# Patient Record
Sex: Male | Born: 1935
Health system: Southern US, Community
[De-identification: ages and names within clinical notes are randomized; demographics above are authoritative.]

## PROBLEM LIST (undated history)

## (undated) DIAGNOSIS — I209 Angina pectoris, unspecified: Secondary | ICD-10-CM

## (undated) DIAGNOSIS — K219 Gastro-esophageal reflux disease without esophagitis: Secondary | ICD-10-CM

## (undated) DIAGNOSIS — D649 Anemia, unspecified: Secondary | ICD-10-CM

## (undated) DIAGNOSIS — H9190 Unspecified hearing loss, unspecified ear: Secondary | ICD-10-CM

## (undated) DIAGNOSIS — R011 Cardiac murmur, unspecified: Secondary | ICD-10-CM

## (undated) DIAGNOSIS — M1990 Unspecified osteoarthritis, unspecified site: Secondary | ICD-10-CM

## (undated) DIAGNOSIS — E7800 Pure hypercholesterolemia, unspecified: Secondary | ICD-10-CM

## (undated) DIAGNOSIS — I10 Essential (primary) hypertension: Secondary | ICD-10-CM

## (undated) DIAGNOSIS — E538 Deficiency of other specified B group vitamins: Secondary | ICD-10-CM

## (undated) DIAGNOSIS — K8050 Calculus of bile duct without cholangitis or cholecystitis without obstruction: Secondary | ICD-10-CM

## (undated) DIAGNOSIS — R251 Tremor, unspecified: Secondary | ICD-10-CM

## (undated) DIAGNOSIS — E119 Type 2 diabetes mellitus without complications: Secondary | ICD-10-CM

## (undated) DIAGNOSIS — J189 Pneumonia, unspecified organism: Secondary | ICD-10-CM

## (undated) HISTORY — DX: Gastro-esophageal reflux disease without esophagitis: K21.9

## (undated) HISTORY — DX: Deficiency of other specified B group vitamins: E53.8

## (undated) HISTORY — PX: APPENDECTOMY: SHX54

## (undated) HISTORY — DX: Anemia, unspecified: D64.9

## (undated) HISTORY — PX: PROSTATE BIOPSY: SHX241

## (undated) HISTORY — PX: HERNIA REPAIR: SHX51

---

## 1992-06-10 DIAGNOSIS — J189 Pneumonia, unspecified organism: Secondary | ICD-10-CM

## 1992-06-10 HISTORY — DX: Pneumonia, unspecified organism: J18.9

## 2005-11-28 ENCOUNTER — Other Ambulatory Visit: Payer: Self-pay

## 2005-11-28 ENCOUNTER — Ambulatory Visit: Payer: Self-pay | Admitting: General Surgery

## 2005-12-03 ENCOUNTER — Ambulatory Visit: Payer: Self-pay | Admitting: General Surgery

## 2008-09-01 ENCOUNTER — Ambulatory Visit: Payer: Self-pay | Admitting: Internal Medicine

## 2008-12-16 ENCOUNTER — Ambulatory Visit: Payer: Self-pay | Admitting: Gastroenterology

## 2008-12-16 HISTORY — PX: COLONOSCOPY: SHX174

## 2014-06-22 DIAGNOSIS — E538 Deficiency of other specified B group vitamins: Secondary | ICD-10-CM | POA: Diagnosis not present

## 2014-07-27 DIAGNOSIS — E538 Deficiency of other specified B group vitamins: Secondary | ICD-10-CM | POA: Diagnosis not present

## 2014-08-10 DIAGNOSIS — E538 Deficiency of other specified B group vitamins: Secondary | ICD-10-CM | POA: Diagnosis not present

## 2014-08-10 DIAGNOSIS — E782 Mixed hyperlipidemia: Secondary | ICD-10-CM | POA: Diagnosis not present

## 2014-08-10 DIAGNOSIS — E119 Type 2 diabetes mellitus without complications: Secondary | ICD-10-CM | POA: Diagnosis not present

## 2014-08-17 DIAGNOSIS — E782 Mixed hyperlipidemia: Secondary | ICD-10-CM | POA: Diagnosis not present

## 2014-08-17 DIAGNOSIS — K219 Gastro-esophageal reflux disease without esophagitis: Secondary | ICD-10-CM | POA: Diagnosis not present

## 2014-08-17 DIAGNOSIS — E538 Deficiency of other specified B group vitamins: Secondary | ICD-10-CM | POA: Diagnosis not present

## 2014-08-17 DIAGNOSIS — E119 Type 2 diabetes mellitus without complications: Secondary | ICD-10-CM | POA: Diagnosis not present

## 2014-08-31 DIAGNOSIS — E538 Deficiency of other specified B group vitamins: Secondary | ICD-10-CM | POA: Diagnosis not present

## 2014-10-05 DIAGNOSIS — E538 Deficiency of other specified B group vitamins: Secondary | ICD-10-CM | POA: Diagnosis not present

## 2014-11-09 DIAGNOSIS — E538 Deficiency of other specified B group vitamins: Secondary | ICD-10-CM | POA: Diagnosis not present

## 2014-12-14 DIAGNOSIS — E538 Deficiency of other specified B group vitamins: Secondary | ICD-10-CM | POA: Diagnosis not present

## 2014-12-15 DIAGNOSIS — E782 Mixed hyperlipidemia: Secondary | ICD-10-CM | POA: Diagnosis not present

## 2014-12-15 DIAGNOSIS — K219 Gastro-esophageal reflux disease without esophagitis: Secondary | ICD-10-CM | POA: Diagnosis not present

## 2014-12-15 DIAGNOSIS — E538 Deficiency of other specified B group vitamins: Secondary | ICD-10-CM | POA: Diagnosis not present

## 2014-12-15 DIAGNOSIS — E119 Type 2 diabetes mellitus without complications: Secondary | ICD-10-CM | POA: Diagnosis not present

## 2014-12-22 DIAGNOSIS — E782 Mixed hyperlipidemia: Secondary | ICD-10-CM | POA: Diagnosis not present

## 2014-12-22 DIAGNOSIS — Z0000 Encounter for general adult medical examination without abnormal findings: Secondary | ICD-10-CM | POA: Diagnosis not present

## 2014-12-22 DIAGNOSIS — E119 Type 2 diabetes mellitus without complications: Secondary | ICD-10-CM | POA: Diagnosis not present

## 2014-12-22 DIAGNOSIS — E538 Deficiency of other specified B group vitamins: Secondary | ICD-10-CM | POA: Diagnosis not present

## 2015-01-18 DIAGNOSIS — E538 Deficiency of other specified B group vitamins: Secondary | ICD-10-CM | POA: Diagnosis not present

## 2015-02-22 DIAGNOSIS — E538 Deficiency of other specified B group vitamins: Secondary | ICD-10-CM | POA: Diagnosis not present

## 2015-03-29 DIAGNOSIS — E538 Deficiency of other specified B group vitamins: Secondary | ICD-10-CM | POA: Diagnosis not present

## 2015-03-29 DIAGNOSIS — Z23 Encounter for immunization: Secondary | ICD-10-CM | POA: Diagnosis not present

## 2015-04-05 DIAGNOSIS — H2513 Age-related nuclear cataract, bilateral: Secondary | ICD-10-CM | POA: Diagnosis not present

## 2015-04-27 DIAGNOSIS — E782 Mixed hyperlipidemia: Secondary | ICD-10-CM | POA: Diagnosis not present

## 2015-04-27 DIAGNOSIS — K5909 Other constipation: Secondary | ICD-10-CM | POA: Diagnosis not present

## 2015-04-27 DIAGNOSIS — E119 Type 2 diabetes mellitus without complications: Secondary | ICD-10-CM | POA: Diagnosis not present

## 2015-04-27 DIAGNOSIS — K219 Gastro-esophageal reflux disease without esophagitis: Secondary | ICD-10-CM | POA: Diagnosis not present

## 2015-05-02 DIAGNOSIS — E538 Deficiency of other specified B group vitamins: Secondary | ICD-10-CM | POA: Diagnosis not present

## 2015-06-02 DIAGNOSIS — E538 Deficiency of other specified B group vitamins: Secondary | ICD-10-CM | POA: Diagnosis not present

## 2015-07-03 DIAGNOSIS — E538 Deficiency of other specified B group vitamins: Secondary | ICD-10-CM | POA: Diagnosis not present

## 2015-08-03 DIAGNOSIS — E538 Deficiency of other specified B group vitamins: Secondary | ICD-10-CM | POA: Diagnosis not present

## 2015-08-22 DIAGNOSIS — E119 Type 2 diabetes mellitus without complications: Secondary | ICD-10-CM | POA: Diagnosis not present

## 2015-08-22 DIAGNOSIS — E782 Mixed hyperlipidemia: Secondary | ICD-10-CM | POA: Diagnosis not present

## 2015-08-29 DIAGNOSIS — L259 Unspecified contact dermatitis, unspecified cause: Secondary | ICD-10-CM | POA: Diagnosis not present

## 2015-08-29 DIAGNOSIS — E782 Mixed hyperlipidemia: Secondary | ICD-10-CM | POA: Diagnosis not present

## 2015-08-29 DIAGNOSIS — E538 Deficiency of other specified B group vitamins: Secondary | ICD-10-CM | POA: Diagnosis not present

## 2015-08-29 DIAGNOSIS — K219 Gastro-esophageal reflux disease without esophagitis: Secondary | ICD-10-CM | POA: Diagnosis not present

## 2015-08-29 DIAGNOSIS — E119 Type 2 diabetes mellitus without complications: Secondary | ICD-10-CM | POA: Diagnosis not present

## 2015-09-05 DIAGNOSIS — E538 Deficiency of other specified B group vitamins: Secondary | ICD-10-CM | POA: Diagnosis not present

## 2015-10-09 DIAGNOSIS — E538 Deficiency of other specified B group vitamins: Secondary | ICD-10-CM | POA: Diagnosis not present

## 2015-11-09 DIAGNOSIS — E538 Deficiency of other specified B group vitamins: Secondary | ICD-10-CM | POA: Diagnosis not present

## 2015-12-13 DIAGNOSIS — E538 Deficiency of other specified B group vitamins: Secondary | ICD-10-CM | POA: Diagnosis not present

## 2015-12-26 DIAGNOSIS — E782 Mixed hyperlipidemia: Secondary | ICD-10-CM | POA: Diagnosis not present

## 2015-12-26 DIAGNOSIS — E538 Deficiency of other specified B group vitamins: Secondary | ICD-10-CM | POA: Diagnosis not present

## 2015-12-26 DIAGNOSIS — E119 Type 2 diabetes mellitus without complications: Secondary | ICD-10-CM | POA: Diagnosis not present

## 2016-01-02 DIAGNOSIS — E119 Type 2 diabetes mellitus without complications: Secondary | ICD-10-CM | POA: Diagnosis not present

## 2016-01-02 DIAGNOSIS — E538 Deficiency of other specified B group vitamins: Secondary | ICD-10-CM | POA: Diagnosis not present

## 2016-01-02 DIAGNOSIS — Z0000 Encounter for general adult medical examination without abnormal findings: Secondary | ICD-10-CM | POA: Diagnosis not present

## 2016-01-02 DIAGNOSIS — Z862 Personal history of diseases of the blood and blood-forming organs and certain disorders involving the immune mechanism: Secondary | ICD-10-CM | POA: Diagnosis not present

## 2016-01-02 DIAGNOSIS — Z7185 Encounter for immunization safety counseling: Secondary | ICD-10-CM | POA: Insufficient documentation

## 2016-01-02 DIAGNOSIS — Z7189 Other specified counseling: Secondary | ICD-10-CM | POA: Insufficient documentation

## 2016-01-02 DIAGNOSIS — Z23 Encounter for immunization: Secondary | ICD-10-CM | POA: Diagnosis not present

## 2016-01-02 DIAGNOSIS — R259 Unspecified abnormal involuntary movements: Secondary | ICD-10-CM | POA: Diagnosis not present

## 2016-01-02 DIAGNOSIS — E782 Mixed hyperlipidemia: Secondary | ICD-10-CM | POA: Diagnosis not present

## 2016-01-15 DIAGNOSIS — E538 Deficiency of other specified B group vitamins: Secondary | ICD-10-CM | POA: Diagnosis not present

## 2016-02-15 DIAGNOSIS — E538 Deficiency of other specified B group vitamins: Secondary | ICD-10-CM | POA: Diagnosis not present

## 2016-02-21 DIAGNOSIS — E538 Deficiency of other specified B group vitamins: Secondary | ICD-10-CM | POA: Diagnosis not present

## 2016-02-21 DIAGNOSIS — R413 Other amnesia: Secondary | ICD-10-CM | POA: Diagnosis not present

## 2016-02-21 DIAGNOSIS — G250 Essential tremor: Secondary | ICD-10-CM | POA: Diagnosis not present

## 2016-03-07 DIAGNOSIS — G250 Essential tremor: Secondary | ICD-10-CM | POA: Insufficient documentation

## 2016-03-18 DIAGNOSIS — E538 Deficiency of other specified B group vitamins: Secondary | ICD-10-CM | POA: Diagnosis not present

## 2016-03-28 ENCOUNTER — Other Ambulatory Visit: Payer: Self-pay | Admitting: Neurology

## 2016-03-28 DIAGNOSIS — G250 Essential tremor: Secondary | ICD-10-CM

## 2016-04-17 ENCOUNTER — Ambulatory Visit
Admission: RE | Admit: 2016-04-17 | Discharge: 2016-04-17 | Disposition: A | Discharge status: Home / Self Care | Payer: Commercial Managed Care - HMO | Source: Ambulatory Visit | Attending: Neurology | Admitting: Neurology

## 2016-04-17 DIAGNOSIS — G250 Essential tremor: Secondary | ICD-10-CM | POA: Diagnosis not present

## 2016-04-17 DIAGNOSIS — R413 Other amnesia: Secondary | ICD-10-CM | POA: Insufficient documentation

## 2016-04-17 DIAGNOSIS — R251 Tremor, unspecified: Secondary | ICD-10-CM | POA: Diagnosis not present

## 2016-04-18 DIAGNOSIS — E538 Deficiency of other specified B group vitamins: Secondary | ICD-10-CM | POA: Diagnosis not present

## 2016-04-19 DIAGNOSIS — H2513 Age-related nuclear cataract, bilateral: Secondary | ICD-10-CM | POA: Diagnosis not present

## 2016-04-30 DIAGNOSIS — E119 Type 2 diabetes mellitus without complications: Secondary | ICD-10-CM | POA: Diagnosis not present

## 2016-04-30 DIAGNOSIS — E782 Mixed hyperlipidemia: Secondary | ICD-10-CM | POA: Diagnosis not present

## 2016-05-09 DIAGNOSIS — K219 Gastro-esophageal reflux disease without esophagitis: Secondary | ICD-10-CM | POA: Diagnosis not present

## 2016-05-09 DIAGNOSIS — E538 Deficiency of other specified B group vitamins: Secondary | ICD-10-CM | POA: Diagnosis not present

## 2016-05-09 DIAGNOSIS — Z862 Personal history of diseases of the blood and blood-forming organs and certain disorders involving the immune mechanism: Secondary | ICD-10-CM | POA: Diagnosis not present

## 2016-05-09 DIAGNOSIS — E782 Mixed hyperlipidemia: Secondary | ICD-10-CM | POA: Diagnosis not present

## 2016-05-09 DIAGNOSIS — E119 Type 2 diabetes mellitus without complications: Secondary | ICD-10-CM | POA: Diagnosis not present

## 2016-05-20 DIAGNOSIS — E538 Deficiency of other specified B group vitamins: Secondary | ICD-10-CM | POA: Diagnosis not present

## 2016-06-20 DIAGNOSIS — E538 Deficiency of other specified B group vitamins: Secondary | ICD-10-CM | POA: Diagnosis not present

## 2016-06-24 DIAGNOSIS — R413 Other amnesia: Secondary | ICD-10-CM | POA: Diagnosis not present

## 2016-06-24 DIAGNOSIS — G250 Essential tremor: Secondary | ICD-10-CM | POA: Diagnosis not present

## 2016-06-24 DIAGNOSIS — E538 Deficiency of other specified B group vitamins: Secondary | ICD-10-CM | POA: Diagnosis not present

## 2016-07-22 DIAGNOSIS — E538 Deficiency of other specified B group vitamins: Secondary | ICD-10-CM | POA: Diagnosis not present

## 2016-08-22 DIAGNOSIS — E538 Deficiency of other specified B group vitamins: Secondary | ICD-10-CM | POA: Diagnosis not present

## 2016-09-23 DIAGNOSIS — E538 Deficiency of other specified B group vitamins: Secondary | ICD-10-CM | POA: Diagnosis not present

## 2016-10-31 DIAGNOSIS — E119 Type 2 diabetes mellitus without complications: Secondary | ICD-10-CM | POA: Diagnosis not present

## 2016-10-31 DIAGNOSIS — E538 Deficiency of other specified B group vitamins: Secondary | ICD-10-CM | POA: Diagnosis not present

## 2016-10-31 DIAGNOSIS — E782 Mixed hyperlipidemia: Secondary | ICD-10-CM | POA: Diagnosis not present

## 2016-10-31 DIAGNOSIS — Z862 Personal history of diseases of the blood and blood-forming organs and certain disorders involving the immune mechanism: Secondary | ICD-10-CM | POA: Diagnosis not present

## 2016-11-07 DIAGNOSIS — J3089 Other allergic rhinitis: Secondary | ICD-10-CM | POA: Diagnosis not present

## 2016-11-07 DIAGNOSIS — Z862 Personal history of diseases of the blood and blood-forming organs and certain disorders involving the immune mechanism: Secondary | ICD-10-CM | POA: Diagnosis not present

## 2016-11-07 DIAGNOSIS — E119 Type 2 diabetes mellitus without complications: Secondary | ICD-10-CM | POA: Diagnosis not present

## 2016-11-07 DIAGNOSIS — E538 Deficiency of other specified B group vitamins: Secondary | ICD-10-CM | POA: Diagnosis not present

## 2016-11-07 DIAGNOSIS — E782 Mixed hyperlipidemia: Secondary | ICD-10-CM | POA: Diagnosis not present

## 2016-11-14 DIAGNOSIS — B9689 Other specified bacterial agents as the cause of diseases classified elsewhere: Secondary | ICD-10-CM | POA: Diagnosis not present

## 2016-11-14 DIAGNOSIS — J019 Acute sinusitis, unspecified: Secondary | ICD-10-CM | POA: Diagnosis not present

## 2016-11-14 DIAGNOSIS — J209 Acute bronchitis, unspecified: Secondary | ICD-10-CM | POA: Diagnosis not present

## 2016-11-21 DIAGNOSIS — R5383 Other fatigue: Secondary | ICD-10-CM | POA: Diagnosis not present

## 2016-12-02 DIAGNOSIS — E538 Deficiency of other specified B group vitamins: Secondary | ICD-10-CM | POA: Diagnosis not present

## 2017-01-02 DIAGNOSIS — E538 Deficiency of other specified B group vitamins: Secondary | ICD-10-CM | POA: Diagnosis not present

## 2017-01-17 ENCOUNTER — Other Ambulatory Visit: Payer: Self-pay | Admitting: Family Medicine

## 2017-01-17 ENCOUNTER — Emergency Department: Payer: Medicare HMO

## 2017-01-17 ENCOUNTER — Ambulatory Visit
Admission: RE | Admit: 2017-01-17 | Discharge: 2017-01-17 | Disposition: A | Discharge status: Home / Self Care | Payer: Medicare HMO | Source: Ambulatory Visit | Attending: Family Medicine | Admitting: Family Medicine

## 2017-01-17 ENCOUNTER — Other Ambulatory Visit: Payer: Self-pay

## 2017-01-17 ENCOUNTER — Inpatient Hospital Stay
Admission: EM | Admit: 2017-01-17 | Discharge: 2017-01-20 | DRG: 871 | Disposition: A | Discharge status: Home / Self Care | Payer: Medicare HMO | Attending: General Surgery | Admitting: General Surgery

## 2017-01-17 DIAGNOSIS — K8000 Calculus of gallbladder with acute cholecystitis without obstruction: Secondary | ICD-10-CM | POA: Diagnosis not present

## 2017-01-17 DIAGNOSIS — R05 Cough: Secondary | ICD-10-CM | POA: Diagnosis not present

## 2017-01-17 DIAGNOSIS — R1011 Right upper quadrant pain: Secondary | ICD-10-CM | POA: Diagnosis not present

## 2017-01-17 DIAGNOSIS — K810 Acute cholecystitis: Secondary | ICD-10-CM | POA: Diagnosis not present

## 2017-01-17 DIAGNOSIS — N179 Acute kidney failure, unspecified: Secondary | ICD-10-CM | POA: Diagnosis present

## 2017-01-17 DIAGNOSIS — R079 Chest pain, unspecified: Secondary | ICD-10-CM | POA: Diagnosis not present

## 2017-01-17 DIAGNOSIS — Z8249 Family history of ischemic heart disease and other diseases of the circulatory system: Secondary | ICD-10-CM | POA: Diagnosis not present

## 2017-01-17 DIAGNOSIS — R0789 Other chest pain: Secondary | ICD-10-CM | POA: Diagnosis not present

## 2017-01-17 DIAGNOSIS — A419 Sepsis, unspecified organism: Principal | ICD-10-CM | POA: Diagnosis present

## 2017-01-17 DIAGNOSIS — K750 Abscess of liver: Secondary | ICD-10-CM | POA: Diagnosis not present

## 2017-01-17 DIAGNOSIS — F17290 Nicotine dependence, other tobacco product, uncomplicated: Secondary | ICD-10-CM | POA: Diagnosis present

## 2017-01-17 DIAGNOSIS — I10 Essential (primary) hypertension: Secondary | ICD-10-CM | POA: Diagnosis present

## 2017-01-17 DIAGNOSIS — E119 Type 2 diabetes mellitus without complications: Secondary | ICD-10-CM | POA: Diagnosis present

## 2017-01-17 DIAGNOSIS — Z79899 Other long term (current) drug therapy: Secondary | ICD-10-CM

## 2017-01-17 DIAGNOSIS — K769 Liver disease, unspecified: Secondary | ICD-10-CM

## 2017-01-17 DIAGNOSIS — Z7984 Long term (current) use of oral hypoglycemic drugs: Secondary | ICD-10-CM

## 2017-01-17 DIAGNOSIS — K7689 Other specified diseases of liver: Secondary | ICD-10-CM | POA: Diagnosis not present

## 2017-01-17 DIAGNOSIS — K839 Disease of biliary tract, unspecified: Secondary | ICD-10-CM | POA: Diagnosis not present

## 2017-01-17 DIAGNOSIS — R109 Unspecified abdominal pain: Secondary | ICD-10-CM | POA: Diagnosis not present

## 2017-01-17 DIAGNOSIS — K651 Peritoneal abscess: Secondary | ICD-10-CM | POA: Diagnosis not present

## 2017-01-17 HISTORY — DX: Type 2 diabetes mellitus without complications: E11.9

## 2017-01-17 HISTORY — DX: Unspecified osteoarthritis, unspecified site: M19.90

## 2017-01-17 HISTORY — DX: Essential (primary) hypertension: I10

## 2017-01-17 LAB — URINALYSIS, COMPLETE (UACMP) WITH MICROSCOPIC
BACTERIA UA: NONE SEEN
Bilirubin Urine: NEGATIVE
Glucose, UA: >=500 mg/dL — AB
HGB URINE DIPSTICK: NEGATIVE
Ketones, ur: NEGATIVE mg/dL
Leukocytes, UA: NEGATIVE
Nitrite: NEGATIVE
PROTEIN: 100 mg/dL — AB
Specific Gravity, Urine: 1.023 (ref 1.005–1.030)
pH: 6 (ref 5.0–8.0)

## 2017-01-17 LAB — COMPREHENSIVE METABOLIC PANEL
ALBUMIN: 3.3 g/dL — AB (ref 3.5–5.0)
ALT: 21 U/L (ref 17–63)
ANION GAP: 9 (ref 5–15)
AST: 29 U/L (ref 15–41)
Alkaline Phosphatase: 89 U/L (ref 38–126)
BUN: 41 mg/dL — ABNORMAL HIGH (ref 6–20)
CHLORIDE: 106 mmol/L (ref 101–111)
CO2: 23 mmol/L (ref 22–32)
Calcium: 9 mg/dL (ref 8.9–10.3)
Creatinine, Ser: 1.56 mg/dL — ABNORMAL HIGH (ref 0.61–1.24)
GFR calc non Af Amer: 39 mL/min — ABNORMAL LOW (ref >60)
GFR, EST AFRICAN AMERICAN: 45 mL/min — AB (ref >60)
GLUCOSE: 349 mg/dL — AB (ref 65–99)
Potassium: 4.3 mmol/L (ref 3.5–5.1)
SODIUM: 138 mmol/L (ref 135–145)
Total Bilirubin: 0.9 mg/dL (ref 0.3–1.2)
Total Protein: 7.6 g/dL (ref 6.5–8.1)

## 2017-01-17 LAB — LIPASE, BLOOD: LIPASE: 24 U/L (ref 11–51)

## 2017-01-17 LAB — CBC
HEMATOCRIT: 34.4 % — AB (ref 40.0–52.0)
HEMOGLOBIN: 11.4 g/dL — AB (ref 13.0–18.0)
MCH: 28.1 pg (ref 26.0–34.0)
MCHC: 33.3 g/dL (ref 32.0–36.0)
MCV: 84.6 fL (ref 80.0–100.0)
Platelets: 216 10*3/uL (ref 150–440)
RBC: 4.07 MIL/uL — AB (ref 4.40–5.90)
RDW: 18.1 % — ABNORMAL HIGH (ref 11.5–14.5)
WBC: 12.8 10*3/uL — ABNORMAL HIGH (ref 3.8–10.6)

## 2017-01-17 LAB — PROTIME-INR
INR: 1.34
PROTHROMBIN TIME: 16.7 s — AB (ref 11.4–15.2)

## 2017-01-17 LAB — LACTIC ACID, PLASMA: LACTIC ACID, VENOUS: 1.8 mmol/L (ref 0.5–1.9)

## 2017-01-17 LAB — TROPONIN I

## 2017-01-17 MED ORDER — IOPAMIDOL (ISOVUE-300) INJECTION 61%
30.0000 mL | Freq: Once | INTRAVENOUS | Status: AC | PRN
  Administered 2017-01-17: 30 mL via ORAL

## 2017-01-17 MED ORDER — DEXTROSE IN LACTATED RINGERS 5 % IV SOLN
INTRAVENOUS | Status: DC
  Administered 2017-01-18 (×2): via INTRAVENOUS

## 2017-01-17 MED ORDER — MORPHINE SULFATE (PF) 2 MG/ML IV SOLN
2.0000 mg | INTRAVENOUS | Status: DC | PRN
  Administered 2017-01-18: 2 mg via INTRAVENOUS
  Filled 2017-01-17: qty 1

## 2017-01-17 MED ORDER — DIPHENHYDRAMINE HCL 12.5 MG/5ML PO ELIX: 12.5000 mg | ORAL_SOLUTION | Freq: Four times a day (QID) | ORAL | Status: DC | PRN

## 2017-01-17 MED ORDER — ONDANSETRON 4 MG PO TBDP: 4.0000 mg | ORAL_TABLET | Freq: Four times a day (QID) | ORAL | Status: DC | PRN

## 2017-01-17 MED ORDER — ONDANSETRON HCL 4 MG/2ML IJ SOLN: 4.0000 mg | Freq: Four times a day (QID) | INTRAMUSCULAR | Status: DC | PRN

## 2017-01-17 MED ORDER — IOPAMIDOL (ISOVUE-300) INJECTION 61%
75.0000 mL | Freq: Once | INTRAVENOUS | Status: AC | PRN
  Administered 2017-01-17: 75 mL via INTRAVENOUS

## 2017-01-17 MED ORDER — DIPHENHYDRAMINE HCL 50 MG/ML IJ SOLN: 12.5000 mg | Freq: Four times a day (QID) | INTRAMUSCULAR | Status: DC | PRN

## 2017-01-17 MED ORDER — PIPERACILLIN-TAZOBACTAM 3.375 G IVPB 30 MIN
3.3750 g | Freq: Once | INTRAVENOUS | Status: AC
  Administered 2017-01-17: 3.375 g via INTRAVENOUS

## 2017-01-17 MED ORDER — ONDANSETRON HCL 4 MG/2ML IJ SOLN
4.0000 mg | INTRAMUSCULAR | Status: AC
  Administered 2017-01-17: 4 mg via INTRAVENOUS
  Filled 2017-01-17: qty 2

## 2017-01-17 MED ORDER — HYDRALAZINE HCL 20 MG/ML IJ SOLN: 10.0000 mg | INTRAMUSCULAR | Status: DC | PRN

## 2017-01-17 MED ORDER — PIPERACILLIN-TAZOBACTAM 3.375 G IVPB
3.3750 g | Freq: Three times a day (TID) | INTRAVENOUS | Status: DC
  Administered 2017-01-18 – 2017-01-19 (×4): 3.375 g via INTRAVENOUS
  Filled 2017-01-17 (×5): qty 50

## 2017-01-17 MED ORDER — PIPERACILLIN-TAZOBACTAM 3.375 G IVPB 30 MIN
INTRAVENOUS | Status: AC
  Filled 2017-01-17: qty 50

## 2017-01-17 MED ORDER — SODIUM CHLORIDE 0.9 % IV BOLUS (SEPSIS)
500.0000 mL | INTRAVENOUS | Status: AC
  Administered 2017-01-17: 500 mL via INTRAVENOUS

## 2017-01-17 MED ORDER — MORPHINE SULFATE (PF) 4 MG/ML IV SOLN
4.0000 mg | Freq: Once | INTRAVENOUS | Status: AC
  Administered 2017-01-17: 4 mg via INTRAVENOUS
  Filled 2017-01-17: qty 1

## 2017-01-17 MED ORDER — INSULIN ASPART 100 UNIT/ML ~~LOC~~ SOLN
0.0000 [IU] | SUBCUTANEOUS | Status: DC
  Administered 2017-01-18: 3 [IU] via SUBCUTANEOUS
  Administered 2017-01-18: 5 [IU] via SUBCUTANEOUS
  Administered 2017-01-18: 3 [IU] via SUBCUTANEOUS
  Filled 2017-01-17 (×3): qty 1

## 2017-01-17 MED ORDER — PANTOPRAZOLE SODIUM 40 MG IV SOLR
40.0000 mg | Freq: Every day | INTRAVENOUS | Status: DC
  Administered 2017-01-18 – 2017-01-19 (×3): 40 mg via INTRAVENOUS
  Filled 2017-01-17 (×3): qty 40

## 2017-01-17 NOTE — ED Provider Notes (Signed)
[facility] Emergency Department Provider Note  ____________________________________________   First MD Initiated Contact with Patient 01/17/17 1824     (approximate)  I have reviewed the triage vital signs and the nursing notes.   HISTORY  Chief Complaint Abdominal Pain    HPI Benjamin Austin is a 83 y.o. male Who presents for evaluation of gradually worsening abdominal pain for about 4 days.  his pain is primarily in the upper abdomen, mostly on the right side.  It is more or less constant although he gets slightly worse after he eats.  He denies nausea and vomiting.  He also denies fever/chills, chest pain, shortness of breath, and dysuria.  he states that the pain gets significantly worse when he coughs.  The pain does not radiate.  He states that nothing in particular makes the pain better.  He had an ultrasound performed as an outpatient which is concerning for acute cholecystitis, but also demonstrates a large hepatic fluid collection which may represent an abscess, and the radiologist recommended CT scan for further evaluation.  Patient reports severe pain at this time and appears uncomfortable but non-toxic.   Past Medical History:  Diagnosis Date  . Arthritis   . Diabetes mellitus without complication ([place])   . Hypertension     There are no active problems to display for this patient.   Past Surgical History:  Procedure Laterality Date  . APPENDECTOMY    . HERNIA REPAIR      Prior to Admission medications   Medication Sig Start Date End Date Taking? Authorizing Provider  Calcium Carbonate-Vitamin D 600-400 MG-UNIT tablet Take 1 tablet by mouth daily.   Yes [provider]  lisinopril (PRINIVIL,ZESTRIL) 20 MG tablet Take 20 mg by mouth daily. 11/07/16  Yes [provider]  metFORMIN (GLUCOPHAGE) 1000 MG tablet Take 1,000 mg by mouth 2 (two) times daily. 11/07/16  Yes [provider]  Omega-3 1000 MG CAPS Take 1,000  mg by mouth daily.   Yes [provider]  pravastatin (PRAVACHOL) 20 MG tablet Take 10 mg by mouth at bedtime. 11/07/16  Yes [provider]    Allergies Patient has no known allergies.  No family history on file.  Social History Social History  Substance Use Topics  . Smoking status: Current Every Day Smoker    Types: Cigars  . Smokeless tobacco: Never Used  . Alcohol use No    Review of Systems Constitutional: No fever/chills Eyes: No visual changes. ENT: No sore throat. Cardiovascular: Denies chest pain. Respiratory: Denies shortness of breath.  Worsening abd pain with deep inspiration Gastrointestinal: worsening upper abd pain x 4 days.  Denies N/V/D Genitourinary: Negative for dysuria. Musculoskeletal: Negative for neck pain.  Negative for back pain. Integumentary: Negative for rash. Neurological: Negative for headaches, focal weakness or numbness.   ____________________________________________   PHYSICAL EXAM:  VITAL SIGNS: ED Triage Vitals  Enc Vitals Group     BP 01/17/17 1614 138/68     Pulse Rate 01/17/17 1614 (!) 110     Resp 01/17/17 1614 16     Temp 01/17/17 1614 98.4 F (36.9 C)     Temp Source 01/17/17 1614 Oral     SpO2 01/17/17 1614 96 %     Weight 01/17/17 1615 71.7 kg (158 lb)     Height --      Head Circumference --      Peak Flow --      Pain Score 01/17/17 1613 10  Pain Loc --      Pain Edu? --      Excl. in [place]? --     Constitutional: Alert and oriented. Well appearing and in no acute distress but does appear uncomfortable Eyes: Conjunctivae are normal.  Head: Atraumatic. Nose: No congestion/rhinnorhea. Mouth/Throat: Mucous membranes are moist. Neck: No stridor.  No meningeal signs.   Cardiovascular: Tachycardia, regular rhythm. Good peripheral circulation. Grossly normal heart sounds. Respiratory: Normal respiratory effort.  No retractions. Lungs CTAB. Gastrointestinal: Soft but with severe TTP of the upper  abdomen including RUQ and epigastrium and +murphy's sign.  Diffuse mild lower abd tenderness but likely referred from the upper abd pain. Musculoskeletal: No lower extremity tenderness nor edema. No gross deformities of extremities. Neurologic:  Normal speech and language. No gross focal neurologic deficits are appreciated.  Skin:  Skin is warm, dry and intact. No rash noted. Psychiatric: Mood and affect are normal. Speech and behavior are normal.  ____________________________________________   LABS (all labs ordered are listed, but only abnormal results are displayed)  Labs Reviewed  COMPREHENSIVE METABOLIC PANEL - Abnormal; Notable for the following:       Result Value   Glucose, Bld 349 (*)    BUN 41 (*)    Creatinine, Ser 1.56 (*)    Albumin 3.3 (*)    GFR calc non Af Amer 39 (*)    GFR calc Af Amer 45 (*)    All other components within normal limits  CBC - Abnormal; Notable for the following:    WBC 12.8 (*)    RBC 4.07 (*)    Hemoglobin 11.4 (*)    HCT 34.4 (*)    RDW 18.1 (*)    All other components within normal limits  URINALYSIS, COMPLETE (UACMP) WITH MICROSCOPIC - Abnormal; Notable for the following:    Color, Urine YELLOW (*)    APPearance CLEAR (*)    Glucose, UA >=500 (*)    Protein, ur 100 (*)    Squamous Epithelial / LPF 0-5 (*)    All other components within normal limits  PROTIME-INR - Abnormal; Notable for the following:    Prothrombin Time 16.7 (*)    All other components within normal limits  CULTURE, BLOOD (ROUTINE X 2)  CULTURE, BLOOD (ROUTINE X 2)  LIPASE, BLOOD  TROPONIN I  LACTIC ACID, PLASMA  LACTIC ACID, PLASMA   ____________________________________________  EKG  ED ECG REPORT I, Roscoe Witts, the attending physician, personally viewed and interpreted this ECG.  Date: 01/17/2017 EKG Time: 16:17 Rate: 104 Rhythm: sinus tachycardia QRS Axis: normal Intervals: normal ST/T Wave abnormalities: T-wave inversion in lead III and  avF Narrative Interpretation: Non-specific ST segment / T-wave changes, but no evidence of acute ischemia.   ____________________________________________  RADIOLOGY   Ct Abdomen Pelvis W Contrast  Result Date: 01/17/2017 CLINICAL DATA:  Leukocytosis. Cholelithiasis. Abdominal pain since 01/14/2017. EXAM: CT ABDOMEN AND PELVIS WITH CONTRAST TECHNIQUE: Multidetector CT imaging of the abdomen and pelvis was performed using the standard protocol following bolus administration of intravenous contrast. CONTRAST:  32mL ISOVUE-300 IOPAMIDOL (ISOVUE-300) INJECTION 61% COMPARISON:  01/17/2017 right upper quadrant ultrasound FINDINGS: Lower chest: Top normal sized heart without pericardial effusion. There is aortic atherosclerosis with three-vessel coronary arteriosclerosis. Small right effusion with adjacent atelectasis and/or scarring. Hepatobiliary: Subcapsular nonspecific fluid is noted along the periphery of the right hepatic lobe measuring 11.4 x 3.7 x 8.5 cm. Stigmata of prior trauma or possibly infection might account for this appearance. The gallbladder is moderately  distended with layering calculi and slight wall thickening near the fundus. Cholecystitis is not excluded. No intrahepatic biliary dilatation. Pancreas: Normal appearing pancreas without mass or ductal dilatation. Spleen: Small splenule measuring 2.1 cm at the splenic hilum without splenomegaly or mass. Adrenals/Urinary Tract: Normal bilateral adrenal glands. 8 mm hypodensity in the upper pole the right kidney too small to further characterize but statistically consistent with a cyst. No nephrolithiasis nor hydroureteronephrosis. The urinary bladder is physiologically distended without acute abnormality. Stomach/Bowel: Contrast distended stomach. Normal small bowel rotation without obstruction or acute inflammation. Status post appendectomy by report. A moderate amount of fecal residue is seen throughout large bowel with scattered colonic  diverticulosis along the sigmoid colon. No evidence of acute diverticulitis. Vascular/Lymphatic: Aortoiliac atherosclerosis without aneurysm. No lymphadenopathy. Reproductive: Normal prostate and seminal vesicles. Other: Fat noted in the right inguinal canal with evidence of prior left inguinal hernia repair. Musculoskeletal: Mild thoracic spondylosis with osteophytes anteriorly. No acute nor suspicious osseous abnormality. Moderate-to-marked lower lumbar facet arthropathy with joint space narrowing and sclerosis most evident L4 through S1. IMPRESSION: 1. Moderate distention of the gallbladder with wall thickening and cholelithiasis. Cholecystitis is not excluded. 2. Slightly complex fluid overlying the right hepatic lobe may represent a subcapsular abscess or possibly hematoma. This measures approximately 11.4 x 3.7 x 8.5 cm. 3. Small right pleural effusion with adjacent atelectasis. 4. Too small to characterize 8 mm right upper pole hypodensity statistically consistent with a cyst. Presently no worrisome features. 5. Fat seen in the right inguinal canal that may represent herniation of properitoneal fat. Status post left inguinal hernia repair. 6. Lower lumbar facet arthropathy. Electronically Signed   By: Ashley Royalty M.D.   On: 01/17/2017 20:52   US Abdomen Limited Ruq  Result Date: 01/17/2017 CLINICAL DATA:  Right upper quadrant pain for 5 days. EXAM: ULTRASOUND ABDOMEN LIMITED RIGHT UPPER QUADRANT COMPARISON:  Abdominal ultrasound 09/01/2008. FINDINGS: Gallbladder: A large volume of sludge is present within the gallbladder. Stones are identified measuring up to 1.1 cm and the gallbladder wall is thickened at 0.5 cm. There is a small amount pericholecystic fluid. Sonographer reports negative Murphy's sign. Common bile duct: Diameter: 0.3 cm Liver: Echogenicity is increased and echotexture is coarsened. There is a fluid collection along the anterior aspect of the right hepatic lobe which contains debris and  measures 7.2 x 2.4 x 6.0 cm. This is not seen on the prior exam. IMPRESSION: Large fluid collection along the anterior right hepatic lobe cannot be definitively characterized but could be an abscess. CT abdomen and pelvis with contrast is recommended for further evaluation. Large volume of gallbladder sludge and stones with wall thickening worrisome for acute cholecystitis. These results will be called to the ordering clinician or representative by the Radiologist Assistant, and communication documented in the PACS or zVision Dashboard. Electronically Signed   By: Inge Rise M.D.   On: 01/17/2017 14:21    ____________________________________________   PROCEDURES  Critical Care performed: Yes, see critical care procedure note(s)   Procedure(s) performed:   .Critical Care Performed by: Hinda Kehr Authorized by: Hinda Kehr   Critical care provider statement:    Critical care time (minutes):  30   Critical care time was exclusive of:  Separately billable procedures and treating other patients   Critical care was necessary to treat or prevent imminent or life-threatening deterioration of the following conditions:  Sepsis   Critical care was time spent personally by me on the following activities:  Development of treatment plan with  patient or surrogate, discussions with consultants, evaluation of patient's response to treatment, examination of patient, obtaining history from patient or surrogate, ordering and performing treatments and interventions, ordering and review of laboratory studies, ordering and review of radiographic studies, pulse oximetry, re-evaluation of patient's condition and review of old charts      ____________________________________________   [place] / [place] / ED COURSE  Pertinent labs & imaging results that were available during my care of the patient were reviewed by me and considered in my medical decision making (see chart for  details). The patient appears uncomfortable but nontoxic.  Given the question of intra-abdominal or hepatic abscess, I will obtain a CT scan as recommended by radiology prior to calling surgery.   The patient does meet sepsis criteria so I will administer empiric antibiotics for intraabdominal infection and make him a code sepsis.  Starting with 1L NS bolus given his age until or unless he demonstrates severe sepsis or shock.  Clinical Course as of Jan 18 2136  Ludwig Clarks Jan 17, 2017  2043 Lactic Acid, Venous: 1.8 [CF]  2058 Discussed case with Dr. Adonis Huguenin.  He is going to review the imaging and come to the ED to evaluate the patient and discuss disposition plan with me. CT Abdomen Pelvis W Contrast [CF]  2134 Dr. Adonis Huguenin has personally evaluated the patient and is currently speaking with [facility] to try and determine if the liver region is more likely to be an abscess or hematoma.  This will determine if the patient is admitted here (as with an abscess) or transferred to an academic institution (if it is a hematoma).    [CF]  2136 Transferring ED care to Dr. Joni Fears to follow up Dr. Reginal Lutes recommendations.  [CF]    Clinical Course User Index [CF] Hinda Kehr, MD    ____________________________________________  FINAL CLINICAL IMPRESSION(S) / ED DIAGNOSES  Final diagnoses:  None     MEDICATIONS GIVEN DURING THIS VISIT:  Medications  morphine 4 MG/ML injection 4 mg (4 mg Intravenous Given 01/17/17 1908)  ondansetron (ZOFRAN) injection 4 mg (4 mg Intravenous Given 01/17/17 1908)  sodium chloride 0.9 % bolus 500 mL (0 mLs Intravenous Stopped 01/17/17 2004)  iopamidol (ISOVUE-300) 61 % injection 30 mL (30 mLs Oral Contrast Given 01/17/17 1911)  piperacillin-tazobactam (ZOSYN) IVPB 3.375 g (0 g Intravenous Stopped 01/17/17 2034)  iopamidol (ISOVUE-300) 61 % injection 75 mL (75 mLs Intravenous Contrast Given 01/17/17 2026)     NEW OUTPATIENT MEDICATIONS STARTED DURING THIS  VISIT:  New Prescriptions   No medications on file    Modified Medications   No medications on file    Discontinued Medications   CALCIUM CARBONATE-VITAMIN D (CALTRATE 600+D PO)    Take 1 tablet by mouth daily.     Note:  This document was prepared using Dragon voice recognition software and may include unintentional dictation errors.    Hinda Kehr, MD 01/17/17 2137

## 2017-01-17 NOTE — ED Triage Notes (Signed)
Pt sent by doctor for possible infection. Pt's wife states several tests were run including a CBC which showed an elevated WBC count. An [place] was performed and pt was told it was possible he was having problems with his gallbladder.  Pt states he has been having abdominal pain since 8/7. Pt denies any other symptoms/.

## 2017-01-17 NOTE — ED Notes (Signed)
Patient r/f CT. 

## 2017-01-17 NOTE — H&P (Signed)
Patient ID: Benjamin Austin, male   DOB: [birth year], 83 y.o.   MRN: [id number]  CC: RUQ PAIN  HPI Benjamin Austin is a 83 y.o. male who presents emergency department for evaluation of right upper quadrant pain. Per report is been going on for about 4 days. He states the pain is constant but worsens with coughing, deep breathing, strenuous activity. He's never had anything like this before. He denies any trauma to his right upper quadrant pain although it did slightly worsen after completing his oral contrast he states it does not formally affected by eating. The pain is a sharp stabbing sensation when its at its worst. Patient denies feelings of fevers or chills but was febrile on presentation to the ER. He denies any chest pain, shortness breath, nausea, vomiting, diarrhea, constipation. Patient was undergoing an outpatient workup for this and was recommended to come to the ER after an ultrasound showed fluid in his liver.  HPI  Past Medical History:  Diagnosis Date  . Arthritis   . Diabetes mellitus without complication ([place])   . Hypertension     Past Surgical History:  Procedure Laterality Date  . APPENDECTOMY    . HERNIA REPAIR      Family history: Father with history of coronary artery disease. No other family history of cancer or diabetes.  Social History Social History  Substance Use Topics  . Smoking status: Current Every Day Smoker    Types: Cigars  . Smokeless tobacco: Never Used  . Alcohol use No    No Known Allergies  No current facility-administered medications for this encounter.    Current Outpatient Prescriptions  Medication Sig Dispense Refill  . Calcium Carbonate-Vitamin D 600-400 MG-UNIT tablet Take 1 tablet by mouth daily.    Marland Kitchen lisinopril (PRINIVIL,ZESTRIL) 20 MG tablet Take 20 mg by mouth daily.    . metFORMIN (GLUCOPHAGE) 1000 MG tablet Take 1,000 mg by mouth 2 (two) times daily.    . Omega-3 1000 MG CAPS Take 1,000 mg by mouth daily.    . pravastatin  (PRAVACHOL) 20 MG tablet Take 10 mg by mouth at bedtime.       Review of Systems A Multi-point review of systems was asked and was negative except for the findings documented in the history of present illness  Physical Exam Blood pressure (!) 143/78, pulse 89, temperature 98.4 F (36.9 C), temperature source Oral, resp. rate (!) 24, height 5\' 7"  (1.702 m), weight 68.7 kg (151 lb 8 oz), SpO2 96 %. CONSTITUTIONAL: Resting in bed in no obvious distress. EYES: Pupils are equal, round, and reactive to light, Sclera are non-icteric. EARS, NOSE, MOUTH AND THROAT: The oropharynx is clear. The oral mucosa is pink and moist. Hearing is intact to voice. LYMPH NODES:  Lymph nodes in the neck are normal. RESPIRATORY:  Lungs are clear. There is normal respiratory effort, with equal breath sounds bilaterally, and without pathologic use of accessory muscles. CARDIOVASCULAR: Heart is regular without murmurs, gallops, or rubs. GI: The abdomen is soft, exquisitely tender to palpation in the right upper quadrant especially the most lateral aspects of the right upper quadrant with a negative Murphy sign on my exam, and mildly distended. There are no palpable masses. There is no hepatosplenomegaly. There are normal bowel sounds in all quadrants. GU: Rectal deferred.   MUSCULOSKELETAL: Normal muscle strength and tone. No cyanosis or edema.   SKIN: Turgor is good and there are no pathologic skin lesions or ulcers. NEUROLOGIC: Motor and sensation is  grossly normal. Cranial nerves are grossly intact. PSYCH:  Oriented to person, place and time. Affect is normal.  Data Reviewed Images and labs reviewed. Labs showed leukocytosis of 12.8, mildly elevated creatinine of 1.56, with the remainder of his labs being within normal limits. Ultrasound and CT scan of the abdomen reviewed. There does appear to be evidence of cholelithiasis with possible gallbladder wall thickening consistent with cholecystitis. There is also a fluid  collection that appears be a subcapsular abscess overlying the right hepatic lobe measuring 11.4 x 3.7 x 8.5 cm. I have personally reviewed the patient's imaging, laboratory findings and medical records.    Assessment    Right upper quadrant pain from combination cholecystitis and subcapsular right hepatic abscess    Plan    83 year old male with right upper quadrant pain. The majority his pain appears related to his subcapsular abscess secondary to his cholecystitis. Discussed the possibility of it being a hematoma vs an abscess with the patient given his presentation of tachycardia, afebrile, lack of trauma discussed the abscess is far more likely. Case was discussed with the on-call interventional radiologist who agreed with this assessment. Plan will be for admission, IV antibiotics, percutaneous drainage performed tomorrow by interventional radiology. Discussed with patient that the likely causes cholecystitis and that he will eventually need a cholecystectomy. However, we will address the abscess versus he is mostly symptomatic from that. All questions answered to the patient's and patient's wife's satisfaction and agree with this plan.     Time spent with the patient was 55 minutes, with more than 50% of the time spent in face-to-face education, counseling and care coordination.     Clayburn Pert, MD FACS General Surgeon 01/17/2017, 10:13 PM

## 2017-01-17 NOTE — ED Notes (Signed)
Bed says "Cleaning" not "Ready", will call report when bed is showing ready.

## 2017-01-18 ENCOUNTER — Inpatient Hospital Stay: Payer: Medicare HMO

## 2017-01-18 ENCOUNTER — Encounter: Payer: Self-pay | Admitting: Interventional Radiology

## 2017-01-18 DIAGNOSIS — K8050 Calculus of bile duct without cholangitis or cholecystitis without obstruction: Secondary | ICD-10-CM

## 2017-01-18 DIAGNOSIS — K810 Acute cholecystitis: Secondary | ICD-10-CM

## 2017-01-18 HISTORY — DX: Calculus of bile duct without cholangitis or cholecystitis without obstruction: K80.50

## 2017-01-18 HISTORY — PX: IR GUIDED DRAIN W CATHETER PLACEMENT: IMG719

## 2017-01-18 LAB — CBC
HEMATOCRIT: 33.3 % — AB (ref 40.0–52.0)
HEMOGLOBIN: 11 g/dL — AB (ref 13.0–18.0)
MCH: 27.7 pg (ref 26.0–34.0)
MCHC: 33 g/dL (ref 32.0–36.0)
MCV: 84.1 fL (ref 80.0–100.0)
Platelets: 196 10*3/uL (ref 150–440)
RBC: 3.96 MIL/uL — ABNORMAL LOW (ref 4.40–5.90)
RDW: 17.7 % — ABNORMAL HIGH (ref 11.5–14.5)
WBC: 11.5 10*3/uL — ABNORMAL HIGH (ref 3.8–10.6)

## 2017-01-18 LAB — COMPREHENSIVE METABOLIC PANEL
ALK PHOS: 81 U/L (ref 38–126)
ALT: 20 U/L (ref 17–63)
ANION GAP: 7 (ref 5–15)
AST: 22 U/L (ref 15–41)
Albumin: 2.7 g/dL — ABNORMAL LOW (ref 3.5–5.0)
BILIRUBIN TOTAL: 1.4 mg/dL — AB (ref 0.3–1.2)
BUN: 30 mg/dL — ABNORMAL HIGH (ref 6–20)
CALCIUM: 8.5 mg/dL — AB (ref 8.9–10.3)
CO2: 27 mmol/L (ref 22–32)
Chloride: 106 mmol/L (ref 101–111)
Creatinine, Ser: 1.36 mg/dL — ABNORMAL HIGH (ref 0.61–1.24)
GFR calc non Af Amer: 46 mL/min — ABNORMAL LOW (ref >60)
GFR, EST AFRICAN AMERICAN: 53 mL/min — AB (ref >60)
Glucose, Bld: 212 mg/dL — ABNORMAL HIGH (ref 65–99)
Potassium: 4.3 mmol/L (ref 3.5–5.1)
SODIUM: 140 mmol/L (ref 135–145)
TOTAL PROTEIN: 7.2 g/dL (ref 6.5–8.1)

## 2017-01-18 MED ORDER — DEXTROSE-NACL 5-0.9 % IV SOLN
INTRAVENOUS | Status: DC
  Administered 2017-01-18: 13:00:00 via INTRAVENOUS

## 2017-01-18 MED ORDER — FENTANYL CITRATE (PF) 100 MCG/2ML IJ SOLN
INTRAMUSCULAR | Status: AC | PRN
  Administered 2017-01-18: 25 ug via INTRAVENOUS

## 2017-01-18 MED ORDER — MIDAZOLAM HCL 2 MG/2ML IJ SOLN
INTRAMUSCULAR | Status: AC | PRN
  Administered 2017-01-18: 1 mg via INTRAVENOUS

## 2017-01-18 MED ORDER — IOPAMIDOL (ISOVUE-300) INJECTION 61%
30.0000 mL | Freq: Once | INTRAVENOUS | Status: AC | PRN
  Administered 2017-01-18: 10 mL

## 2017-01-18 MED ORDER — SODIUM CHLORIDE 0.9% FLUSH
5.0000 mL | Freq: Three times a day (TID) | INTRAVENOUS | Status: DC
  Administered 2017-01-18 – 2017-01-20 (×5): 5 mL

## 2017-01-18 MED ORDER — MIDAZOLAM HCL 5 MG/5ML IJ SOLN
INTRAMUSCULAR | Status: AC
Start: 2017-01-18 — End: 2017-01-18
  Filled 2017-01-18: qty 5

## 2017-01-18 MED ORDER — SODIUM CHLORIDE 0.9% FLUSH: 5.0000 mL | Freq: Three times a day (TID) | INTRAVENOUS | Status: DC

## 2017-01-18 MED ORDER — LIDOCAINE HCL (PF) 1 % IJ SOLN
INTRAMUSCULAR | Status: AC
  Filled 2017-01-18: qty 30

## 2017-01-18 MED ORDER — FENTANYL CITRATE (PF) 100 MCG/2ML IJ SOLN
INTRAMUSCULAR | Status: AC
  Filled 2017-01-18: qty 2

## 2017-01-18 MED ORDER — INSULIN ASPART 100 UNIT/ML ~~LOC~~ SOLN
0.0000 [IU] | Freq: Three times a day (TID) | SUBCUTANEOUS | Status: DC
  Administered 2017-01-19 (×3): 3 [IU] via SUBCUTANEOUS
  Administered 2017-01-20: 2 [IU] via SUBCUTANEOUS
  Administered 2017-01-20: 1 [IU] via SUBCUTANEOUS
  Filled 2017-01-18 (×5): qty 1

## 2017-01-18 NOTE — Procedures (Signed)
R perihepatic biloma/abscess drain - frank pus Cholecystostomy drain - frank pus EBL 0 Comp 0

## 2017-01-18 NOTE — Progress Notes (Signed)
Report given to [place], RN of room 211. Pt. Drains x 2 to right lateral chest intact; draining brown thin liquid in copious amounts. Pt. Responds to verbal stimulus. No acute distress noted. Sample drawn from Gallbladder site taken to lab by Arnette Schaumann, RN now.

## 2017-01-18 NOTE — Progress Notes (Signed)
Pharmacy Antibiotic Note  Benjamin Austin is a 83 y.o. male admitted on 01/17/2017 with cholecystitis/subcapsular abscess.  Pharmacy has been consulted for Zosyn dosing.  Plan: Zosyn 3.375g IV q8h (4 hour infusion).  Height: 5\' 8"  (172.7 cm) Weight: 158 lb 9.6 oz (71.9 kg) IBW/kg (Calculated) : 68.4  Temp (24hrs), Avg:98.6 F (37 C), Min:98.4 F (36.9 C), Max:98.7 F (37.1 C)   Recent Labs Lab 01/17/17 1621 01/17/17 1952  WBC 12.8*  --   CREATININE 1.56*  --   LATICACIDVEN  --  1.8    Estimated Creatinine Clearance: 34.1 mL/min (A) (by C-G formula based on SCr of 1.56 mg/dL (H)).    No Known Allergies  Antimicrobials this admission: Zosyn 8/10 >>    >>   Dose adjustments this admission:   Microbiology results: 8/10 BCx: pending      8/10 UA: (-)  Thank you for allowing pharmacy to be a part of this patient's care.  Aretta Stetzel S 01/18/2017 12:29 AM

## 2017-01-18 NOTE — Consult Note (Signed)
Chief Complaint: Patient was seen in consultation today for  Chief Complaint  Patient presents with  . Abdominal Pain   at the request of * No referring provider recorded for this case *  Referring Physician(s): Adonis Huguenin *  Supervising Physician: Marybelle Killings  Patient Status: [facility] - In-pt  History of Present Illness: Benjamin Austin is a 83 y.o. male who presented with abdominal pain and sepsis yesterday. By imaging and physical exam, he has acute cholecystitis and perihepatic biloma/abscess.  Past Medical History:  Diagnosis Date  . Arthritis   . Diabetes mellitus without complication ([place])   . Hypertension     Past Surgical History:  Procedure Laterality Date  . APPENDECTOMY    . HERNIA REPAIR      Allergies: Patient has no known allergies.  Medications: Prior to Admission medications   Medication Sig Start Date End Date Taking? Authorizing Provider  Calcium Carbonate-Vitamin D 600-400 MG-UNIT tablet Take 1 tablet by mouth daily.   Yes [provider]  lisinopril (PRINIVIL,ZESTRIL) 20 MG tablet Take 20 mg by mouth daily. 11/07/16  Yes [provider]  metFORMIN (GLUCOPHAGE) 1000 MG tablet Take 1,000 mg by mouth 2 (two) times daily. 11/07/16  Yes [provider]  Omega-3 1000 MG CAPS Take 1,000 mg by mouth daily.   Yes [provider]  pravastatin (PRAVACHOL) 20 MG tablet Take 10 mg by mouth at bedtime. 11/07/16  Yes [provider]     No family history on file.  Social History   Social History  . Marital status: Married    Spouse name: N/A  . Number of children: N/A  . Years of education: N/A   Social History Main Topics  . Smoking status: Current Every Day Smoker    Types: Cigars  . Smokeless tobacco: Never Used  . Alcohol use No  . Drug use: No  . Sexual activity: Not Asked   Other Topics Concern  . None   Social History Narrative  . None     Review of Systems: A 12 point ROS discussed and  pertinent positives are indicated in the HPI above.  All other systems are negative.  Review of Systems  Vital Signs: BP 137/73 (BP Location: Left Arm)   Pulse 78   Temp 99 F (37.2 C) (Oral)   Resp 18   Ht 5\' 8"  (1.727 m)   Wt 158 lb 9.6 oz (71.9 kg)   SpO2 95%   BMI 24.12 kg/m   Physical Exam  Constitutional: He is oriented to person, place, and time. He appears well-developed and well-nourished.  HENT:  Head: Normocephalic and atraumatic.  Cardiovascular: Normal rate and regular rhythm.   Pulmonary/Chest: Effort normal and breath sounds normal.  Abdominal:  RUQ tenderness No rebound or guarding  Neurological: He is alert and oriented to person, place, and time.    Mallampati Score:   2  Imaging: Ct Abdomen Pelvis W Contrast  Result Date: 01/17/2017 CLINICAL DATA:  Leukocytosis. Cholelithiasis. Abdominal pain since 01/14/2017. EXAM: CT ABDOMEN AND PELVIS WITH CONTRAST TECHNIQUE: Multidetector CT imaging of the abdomen and pelvis was performed using the standard protocol following bolus administration of intravenous contrast. CONTRAST:  41mL ISOVUE-300 IOPAMIDOL (ISOVUE-300) INJECTION 61% COMPARISON:  01/17/2017 right upper quadrant ultrasound FINDINGS: Lower chest: Top normal sized heart without pericardial effusion. There is aortic atherosclerosis with three-vessel coronary arteriosclerosis. Small right effusion with adjacent atelectasis and/or scarring. Hepatobiliary: Subcapsular nonspecific fluid is noted along the periphery of the  right hepatic lobe measuring 11.4 x 3.7 x 8.5 cm. Stigmata of prior trauma or possibly infection might account for this appearance. The gallbladder is moderately distended with layering calculi and slight wall thickening near the fundus. Cholecystitis is not excluded. No intrahepatic biliary dilatation. Pancreas: Normal appearing pancreas without mass or ductal dilatation. Spleen: Small splenule measuring 2.1 cm at the splenic hilum without  splenomegaly or mass. Adrenals/Urinary Tract: Normal bilateral adrenal glands. 8 mm hypodensity in the upper pole the right kidney too small to further characterize but statistically consistent with a cyst. No nephrolithiasis nor hydroureteronephrosis. The urinary bladder is physiologically distended without acute abnormality. Stomach/Bowel: Contrast distended stomach. Normal small bowel rotation without obstruction or acute inflammation. Status post appendectomy by report. A moderate amount of fecal residue is seen throughout large bowel with scattered colonic diverticulosis along the sigmoid colon. No evidence of acute diverticulitis. Vascular/Lymphatic: Aortoiliac atherosclerosis without aneurysm. No lymphadenopathy. Reproductive: Normal prostate and seminal vesicles. Other: Fat noted in the right inguinal canal with evidence of prior left inguinal hernia repair. Musculoskeletal: Mild thoracic spondylosis with osteophytes anteriorly. No acute nor suspicious osseous abnormality. Moderate-to-marked lower lumbar facet arthropathy with joint space narrowing and sclerosis most evident L4 through S1. IMPRESSION: 1. Moderate distention of the gallbladder with wall thickening and cholelithiasis. Cholecystitis is not excluded. 2. Slightly complex fluid overlying the right hepatic lobe may represent a subcapsular abscess or possibly hematoma. This measures approximately 11.4 x 3.7 x 8.5 cm. 3. Small right pleural effusion with adjacent atelectasis. 4. Too small to characterize 8 mm right upper pole hypodensity statistically consistent with a cyst. Presently no worrisome features. 5. Fat seen in the right inguinal canal that may represent herniation of properitoneal fat. Status post left inguinal hernia repair. 6. Lower lumbar facet arthropathy. Electronically Signed   By: Ashley Royalty M.D.   On: 01/17/2017 20:52   US Abdomen Limited Ruq  Result Date: 01/17/2017 CLINICAL DATA:  Right upper quadrant pain for 5 days. EXAM:  ULTRASOUND ABDOMEN LIMITED RIGHT UPPER QUADRANT COMPARISON:  Abdominal ultrasound 09/01/2008. FINDINGS: Gallbladder: A large volume of sludge is present within the gallbladder. Stones are identified measuring up to 1.1 cm and the gallbladder wall is thickened at 0.5 cm. There is a small amount pericholecystic fluid. Sonographer reports negative Murphy's sign. Common bile duct: Diameter: 0.3 cm Liver: Echogenicity is increased and echotexture is coarsened. There is a fluid collection along the anterior aspect of the right hepatic lobe which contains debris and measures 7.2 x 2.4 x 6.0 cm. This is not seen on the prior exam. IMPRESSION: Large fluid collection along the anterior right hepatic lobe cannot be definitively characterized but could be an abscess. CT abdomen and pelvis with contrast is recommended for further evaluation. Large volume of gallbladder sludge and stones with wall thickening worrisome for acute cholecystitis. These results will be called to the ordering clinician or representative by the Radiologist Assistant, and communication documented in the PACS or zVision Dashboard. Electronically Signed   By: Inge Rise M.D.   On: 01/17/2017 14:21    Labs:  CBC:  Recent Labs  01/17/17 1621 01/18/17 0446  WBC 12.8* 11.5*  HGB 11.4* 11.0*  HCT 34.4* 33.3*  PLT 216 196    COAGS:  Recent Labs  01/17/17 1952  INR 1.34    BMP:  Recent Labs  01/17/17 1621 01/18/17 0446  NA 138 140  K 4.3 4.3  CL 106 106  CO2 23 27  GLUCOSE 349* 212*  BUN 41*  30*  CALCIUM 9.0 8.5*  CREATININE 1.56* 1.36*  GFRNONAA 39* 46*  GFRAA 45* 53*    LIVER FUNCTION TESTS:  Recent Labs  01/17/17 1621 01/18/17 0446  BILITOT 0.9 1.4*  AST 29 22  ALT 21 20  ALKPHOS 89 81  PROT 7.6 7.2  ALBUMIN 3.3* 2.7*    TUMOR MARKERS: No results for input(s): AFPTM, CEA, CA199, CHROMGRNA in the last 8760 hours.  Assessment and Plan:  Given advanced age, he currently not a great  cholecystectomy candidate. His sepsis and vitals have improved with hydration and antibiotics. Will proceed with perihepatic drain and cholecystostomy. This was discussed with Dr. Dahlia Byes who agrees with management.  Thank you for this interesting consult.  I greatly enjoyed meeting RISHITH SIDDOWAY and look forward to participating in their care.  A copy of this report was sent to the requesting provider on this date.  Electronically Signed: Shreena Baines, ART A, MD 01/18/2017, 9:44 AM   I spent a total of 40 Minutes  in face to face in clinical consultation, greater than 50% of which was counseling/coordinating care for abdominal drains.

## 2017-01-18 NOTE — Sedation Documentation (Signed)
Total Versed 1 mg IV, Fentanyl 25 mcg IV sedation given. Pt. Tolerated procedure well.

## 2017-01-18 NOTE — Sedation Documentation (Signed)
o2 SAT 97, but pt. Resp. Shallow. Pt. Changed to NRB mask for case.

## 2017-01-18 NOTE — Progress Notes (Addendum)
CC: RUQ abscess w cholecystitis Subjective: S/p drain placement  And chole tube Feeling better Mild pain U/S and CT scan personally reviewed  Objective: Vital signs in last 24 hours: Temp:  [98 F (36.7 C)-99 F (37.2 C)] 98.6 F (37 C) (08/11 1235) Pulse Rate:  [72-110] 76 (08/11 1235) Resp:  [14-26] 14 (08/11 1235) BP: (130-182)/(64-94) 142/67 (08/11 1235) SpO2:  [92 %-100 %] 95 % (08/11 1235) Weight:  [68.7 kg (151 lb 8 oz)-71.9 kg (158 lb 9.6 oz)] 71.9 kg (158 lb 9.6 oz) (08/10 2344) Last BM Date: 01/14/17  Intake/Output from previous day: 08/10 0701 - 08/11 0700 In: 950 [I.V.:400; IV Piggyback:550] Out: 540 [Urine:540] Intake/Output this shift: No intake/output data recorded.  Physical exam: NAD Abd: soft, mild TTP RUQ, drain w purulence fluid. No peritonitis Ext: no edema and well perfused. Lab Results: CBC   Recent Labs  01/17/17 1621 01/18/17 0446  WBC 12.8* 11.5*  HGB 11.4* 11.0*  HCT 34.4* 33.3*  PLT 216 196   BMET  Recent Labs  01/17/17 1621 01/18/17 0446  NA 138 140  K 4.3 4.3  CL 106 106  CO2 23 27  GLUCOSE 349* 212*  BUN 41* 30*  CREATININE 1.56* 1.36*  CALCIUM 9.0 8.5*   PT/INR  Recent Labs  01/17/17 1952  LABPROT 16.7*  INR 1.34   ABG No results for input(s): PHART, HCO3 in the last 72 hours.  Invalid input(s): PCO2, PO2  Studies/Results: Ct Abdomen Pelvis W Contrast  Result Date: 01/17/2017 CLINICAL DATA:  Leukocytosis. Cholelithiasis. Abdominal pain since 01/14/2017. EXAM: CT ABDOMEN AND PELVIS WITH CONTRAST TECHNIQUE: Multidetector CT imaging of the abdomen and pelvis was performed using the standard protocol following bolus administration of intravenous contrast. CONTRAST:  71mL ISOVUE-300 IOPAMIDOL (ISOVUE-300) INJECTION 61% COMPARISON:  01/17/2017 right upper quadrant ultrasound FINDINGS: Lower chest: Top normal sized heart without pericardial effusion. There is aortic atherosclerosis with three-vessel coronary  arteriosclerosis. Small right effusion with adjacent atelectasis and/or scarring. Hepatobiliary: Subcapsular nonspecific fluid is noted along the periphery of the right hepatic lobe measuring 11.4 x 3.7 x 8.5 cm. Stigmata of prior trauma or possibly infection might account for this appearance. The gallbladder is moderately distended with layering calculi and slight wall thickening near the fundus. Cholecystitis is not excluded. No intrahepatic biliary dilatation. Pancreas: Normal appearing pancreas without mass or ductal dilatation. Spleen: Small splenule measuring 2.1 cm at the splenic hilum without splenomegaly or mass. Adrenals/Urinary Tract: Normal bilateral adrenal glands. 8 mm hypodensity in the upper pole the right kidney too small to further characterize but statistically consistent with a cyst. No nephrolithiasis nor hydroureteronephrosis. The urinary bladder is physiologically distended without acute abnormality. Stomach/Bowel: Contrast distended stomach. Normal small bowel rotation without obstruction or acute inflammation. Status post appendectomy by report. A moderate amount of fecal residue is seen throughout large bowel with scattered colonic diverticulosis along the sigmoid colon. No evidence of acute diverticulitis. Vascular/Lymphatic: Aortoiliac atherosclerosis without aneurysm. No lymphadenopathy. Reproductive: Normal prostate and seminal vesicles. Other: Fat noted in the right inguinal canal with evidence of prior left inguinal hernia repair. Musculoskeletal: Mild thoracic spondylosis with osteophytes anteriorly. No acute nor suspicious osseous abnormality. Moderate-to-marked lower lumbar facet arthropathy with joint space narrowing and sclerosis most evident L4 through S1. IMPRESSION: 1. Moderate distention of the gallbladder with wall thickening and cholelithiasis. Cholecystitis is not excluded. 2. Slightly complex fluid overlying the right hepatic lobe may represent a subcapsular abscess or  possibly hematoma. This measures approximately 11.4 x 3.7 x 8.5  cm. 3. Small right pleural effusion with adjacent atelectasis. 4. Too small to characterize 8 mm right upper pole hypodensity statistically consistent with a cyst. Presently no worrisome features. 5. Fat seen in the right inguinal canal that may represent herniation of properitoneal fat. Status post left inguinal hernia repair. 6. Lower lumbar facet arthropathy. Electronically Signed   By: Ashley Royalty M.D.   On: 01/17/2017 20:52   Ir Guided Niel Hummer W Catheter Placement  Result Date: 01/18/2017 INDICATION: Perihepatic Abscess.  Acute calculus cholecystitis. EXAM: ABSCESS DRAINAGE.  CHOLECYSTOSTOMY. MEDICATIONS: None ANESTHESIA/SEDATION: Fentanyl 25 mcg IV; Versed 1 mg IV Moderate Sedation Time:  15 The patient was continuously monitored during the procedure by the interventional radiology nurse under my direct supervision. FLUOROSCOPY TIME:  Fluoroscopy Time: 3 minutes  seconds ( mGy). COMPLICATIONS: None immediate. PROCEDURE: Informed written consent was obtained from the patient after a thorough discussion of the procedural risks, benefits and alternatives. All questions were addressed. Maximal Sterile Barrier Technique was utilized including caps, mask, sterile gowns, sterile gloves, sterile drape, hand hygiene and skin antiseptic. A timeout was performed prior to the initiation of the procedure. The right upper quadrant was prepped with ChloraPrep in a sterile fashion, and a sterile drape was applied covering the operative field. A sterile gown and sterile gloves were used for the procedure. Under sonographic guidance, a 21 gauge needle was inserted into the perihepatic abscess and removed over a 018 wire which was up sized to a 3 J. A 10 French dilator followed by a 10 [place] drain were inserted. It was looped and string fixed then sewn to the skin. Frank pus was aspirated. Subsequently, a 21 gauge needle was inserted into the gallbladder lumen  under sonographic guidance and via transhepatic approach. It was removed over a 018 wire, which was upsized to a 3-J. A 10-French drain was advanced over the wire and coiled in the gallbladder lumen. It was sewn to the skin after being string fixed. FINDINGS: Imaging demonstrates 72 French drain placement into a perihepatic abscess. Subsequent imaging demonstrates 10 French drain placement into the lumen of the gallbladder. The cystic duct is occluded. IMPRESSION: Successful cholecystostomy and perihepatic abscess drain. The cholecystostomy tube needs to remain in place at least 6 weeks. Electronically Signed   By: Marybelle Killings M.D.   On: 01/18/2017 11:06   US Abdomen Limited Ruq  Result Date: 01/17/2017 CLINICAL DATA:  Right upper quadrant pain for 5 days. EXAM: ULTRASOUND ABDOMEN LIMITED RIGHT UPPER QUADRANT COMPARISON:  Abdominal ultrasound 09/01/2008. FINDINGS: Gallbladder: A large volume of sludge is present within the gallbladder. Stones are identified measuring up to 1.1 cm and the gallbladder wall is thickened at 0.5 cm. There is a small amount pericholecystic fluid. Sonographer reports negative Murphy's sign. Common bile duct: Diameter: 0.3 cm Liver: Echogenicity is increased and echotexture is coarsened. There is a fluid collection along the anterior aspect of the right hepatic lobe which contains debris and measures 7.2 x 2.4 x 6.0 cm. This is not seen on the prior exam. IMPRESSION: Large fluid collection along the anterior right hepatic lobe cannot be definitively characterized but could be an abscess. CT abdomen and pelvis with contrast is recommended for further evaluation. Large volume of gallbladder sludge and stones with wall thickening worrisome for acute cholecystitis. These results will be called to the ordering clinician or representative by the Radiologist Assistant, and communication documented in the PACS or zVision Dashboard. Electronically Signed   By: Inge Rise M.D.   On:  01/17/2017 14:21    Anti-infectives: Anti-infectives    Start     Dose/Rate Route Frequency Ordered Stop   01/18/17 0400  piperacillin-tazobactam (ZOSYN) IVPB 3.375 g     3.375 g 12.5 mL/hr over 240 Minutes Intravenous Every 8 hours 01/17/17 2355     01/17/17 1930  piperacillin-tazobactam (ZOSYN) IVPB 3.375 g     3.375 g 100 mL/hr over 30 Minutes Intravenous  Once 01/17/17 1924 01/17/17 2034      Assessment/Plan: RUQ abscess w cholecystitis Continue drains and A/Bs No surgical intervention Clears  AKI continue IVF  Caroleen Hamman, MD, FACS  01/18/2017

## 2017-01-19 LAB — COMPREHENSIVE METABOLIC PANEL
ALBUMIN: 2.5 g/dL — AB (ref 3.5–5.0)
ALK PHOS: 80 U/L (ref 38–126)
ALT: 18 U/L (ref 17–63)
ANION GAP: 8 (ref 5–15)
AST: 20 U/L (ref 15–41)
BUN: 20 mg/dL (ref 6–20)
CO2: 24 mmol/L (ref 22–32)
Calcium: 8.1 mg/dL — ABNORMAL LOW (ref 8.9–10.3)
Chloride: 107 mmol/L (ref 101–111)
Creatinine, Ser: 1.15 mg/dL (ref 0.61–1.24)
GFR calc Af Amer: >60 mL/min (ref >60)
GFR calc non Af Amer: 57 mL/min — ABNORMAL LOW (ref >60)
GLUCOSE: 241 mg/dL — AB (ref 65–99)
POTASSIUM: 3.6 mmol/L (ref 3.5–5.1)
SODIUM: 139 mmol/L (ref 135–145)
Total Bilirubin: 0.8 mg/dL (ref 0.3–1.2)
Total Protein: 6.4 g/dL — ABNORMAL LOW (ref 6.5–8.1)

## 2017-01-19 LAB — CBC
HCT: 32 % — ABNORMAL LOW (ref 40.0–52.0)
HEMOGLOBIN: 10.5 g/dL — AB (ref 13.0–18.0)
MCH: 27.8 pg (ref 26.0–34.0)
MCHC: 33 g/dL (ref 32.0–36.0)
MCV: 84.3 fL (ref 80.0–100.0)
Platelets: 207 10*3/uL (ref 150–440)
RBC: 3.79 MIL/uL — ABNORMAL LOW (ref 4.40–5.90)
RDW: 17.7 % — AB (ref 11.5–14.5)
WBC: 8.7 10*3/uL (ref 3.8–10.6)

## 2017-01-19 MED ORDER — LACTATED RINGERS IV SOLN
INTRAVENOUS | Status: DC
  Administered 2017-01-19: 04:00:00 via INTRAVENOUS

## 2017-01-19 MED ORDER — METRONIDAZOLE 500 MG PO TABS
500.0000 mg | ORAL_TABLET | Freq: Three times a day (TID) | ORAL | Status: DC
  Administered 2017-01-19 – 2017-01-20 (×3): 500 mg via ORAL
  Filled 2017-01-19 (×5): qty 1

## 2017-01-19 MED ORDER — ENSURE ENLIVE PO LIQD
237.0000 mL | Freq: Two times a day (BID) | ORAL | Status: DC
  Administered 2017-01-19 – 2017-01-20 (×2): 237 mL via ORAL

## 2017-01-19 MED ORDER — METFORMIN HCL 500 MG PO TABS
1000.0000 mg | ORAL_TABLET | Freq: Two times a day (BID) | ORAL | Status: DC
  Administered 2017-01-19 – 2017-01-20 (×2): 1000 mg via ORAL
  Filled 2017-01-19 (×2): qty 2

## 2017-01-19 MED ORDER — HYDROCODONE-ACETAMINOPHEN 5-325 MG PO TABS: 1.0000 | ORAL_TABLET | Freq: Four times a day (QID) | ORAL | Status: DC | PRN

## 2017-01-19 MED ORDER — CIPROFLOXACIN HCL 500 MG PO TABS
500.0000 mg | ORAL_TABLET | Freq: Two times a day (BID) | ORAL | Status: DC
  Administered 2017-01-19 – 2017-01-20 (×3): 500 mg via ORAL
  Filled 2017-01-19 (×3): qty 1

## 2017-01-19 NOTE — Progress Notes (Signed)
Initial Nutrition Assessment  DOCUMENTATION CODES:   Not applicable  INTERVENTION:   Ensure Enlive po BID, each supplement provides 350 kcal and 20 grams of protein- can discontinue if pt continues to have elevated blood sugars.   NUTRITION DIAGNOSIS:   Increased nutrient needs related to  (cholecystitis w/ abscess ) as evidenced by increased estimated needs from protein.  GOAL:   Patient will meet greater than or equal to 90% of their needs  MONITOR:   PO intake, Supplement acceptance, Labs, Weight trends  REASON FOR ASSESSMENT:   Malnutrition Screening Tool    ASSESSMENT:   83 y.o. male admitted on 01/17/2017 with cholecystitis/subcapsular abscess. Now s/p perihepatic drain and cholecystostomy   Met with pt in room today. Pt reports good appetite and oral intake pta. Pt also reports stable wts. Pt eating at time of RD visit today; pt eating 100% of meals. Pt with increased estimated needs r/t abscess. Pt prefers strawberry flavor; RD will order Ensure. Can discontinue or switch to Premier Protein if continued elevated blood sugars. Pt schedulded to possibly discharge tomorrow.    Medications reviewed and include: ciprofloxacin, insulin, metformin, metronidazole, protonix  Labs reviewed: Ca 8.1(L) adj. 9.3 wnl, alb 2.5(L) cbgs- 349, 212, 241 x 48 hrs   Nutrition-Focused physical exam completed. Findings are no fat depletion, no muscle depletion, and no edema.   Diet Order:  Diet Carb Modified Fluid consistency: Thin; Room service appropriate? Yes  Skin:  Reviewed, no issues  Last BM:  8/9- constipation   Height:   Ht Readings from Last 1 Encounters:  01/17/17 '5\' 8"'$  (1.727 m)    Weight:   Wt Readings from Last 1 Encounters:  01/17/17 158 lb 9.6 oz (71.9 kg)    Ideal Body Weight:  70 kg  BMI:  Body mass index is 24.12 kg/m.  Estimated Nutritional Needs:   Kcal:  1800-2100kcal/day   Protein:  79-93g/day   Fluid:  >1.8L/day   EDUCATION NEEDS:    Education needs addressed  Koleen Distance MS, RD, LDN Pager #[phone] After Hours Pager: [phone]

## 2017-01-19 NOTE — Progress Notes (Signed)
CC: Subcapsular abscess and cholecystitis status post drain placement and cholecystostomy tube Subjective: Doing well, pain improved Wbc down  Objective: Vital signs in last 24 hours: Temp:  [98.2 F (36.8 C)-98.6 F (37 C)] 98.2 F (36.8 C) (08/12 0529) Pulse Rate:  [72-87] 72 (08/12 0529) Resp:  [14-20] 20 (08/12 0529) BP: (142-153)/(61-77) 153/61 (08/12 0529) SpO2:  [95 %-97 %] 96 % (08/12 0529) Last BM Date: 01/16/17  Intake/Output from previous day: 08/11 0701 - 08/12 0700 In: 1217 [I.V.:1007; IV Piggyback:200] Out: 1158 [Urine:425; Drains:733] Intake/Output this shift: No intake/output data recorded.  Physical exam: Elderly male in NAD Abd: soft, drains in place purulent content. No peritonitis Ext: no edema and well perfused Neuro: awake and alert. gcs 15  Lab Results: CBC   Recent Labs  01/18/17 0446 01/19/17 0351  WBC 11.5* 8.7  HGB 11.0* 10.5*  HCT 33.3* 32.0*  PLT 196 207   BMET  Recent Labs  01/18/17 0446 01/19/17 0351  NA 140 139  K 4.3 3.6  CL 106 107  CO2 27 24  GLUCOSE 212* 241*  BUN 30* 20  CREATININE 1.36* 1.15  CALCIUM 8.5* 8.1*   PT/INR  Recent Labs  01/17/17 1952  LABPROT 16.7*  INR 1.34   ABG No results for input(s): PHART, HCO3 in the last 72 hours.  Invalid input(s): PCO2, PO2  Studies/Results: Ct Abdomen Pelvis W Contrast  Result Date: 01/17/2017 CLINICAL DATA:  Leukocytosis. Cholelithiasis. Abdominal pain since 01/14/2017. EXAM: CT ABDOMEN AND PELVIS WITH CONTRAST TECHNIQUE: Multidetector CT imaging of the abdomen and pelvis was performed using the standard protocol following bolus administration of intravenous contrast. CONTRAST:  89mL ISOVUE-300 IOPAMIDOL (ISOVUE-300) INJECTION 61% COMPARISON:  01/17/2017 right upper quadrant ultrasound FINDINGS: Lower chest: Top normal sized heart without pericardial effusion. There is aortic atherosclerosis with three-vessel coronary arteriosclerosis. Small right effusion with  adjacent atelectasis and/or scarring. Hepatobiliary: Subcapsular nonspecific fluid is noted along the periphery of the right hepatic lobe measuring 11.4 x 3.7 x 8.5 cm. Stigmata of prior trauma or possibly infection might account for this appearance. The gallbladder is moderately distended with layering calculi and slight wall thickening near the fundus. Cholecystitis is not excluded. No intrahepatic biliary dilatation. Pancreas: Normal appearing pancreas without mass or ductal dilatation. Spleen: Small splenule measuring 2.1 cm at the splenic hilum without splenomegaly or mass. Adrenals/Urinary Tract: Normal bilateral adrenal glands. 8 mm hypodensity in the upper pole the right kidney too small to further characterize but statistically consistent with a cyst. No nephrolithiasis nor hydroureteronephrosis. The urinary bladder is physiologically distended without acute abnormality. Stomach/Bowel: Contrast distended stomach. Normal small bowel rotation without obstruction or acute inflammation. Status post appendectomy by report. A moderate amount of fecal residue is seen throughout large bowel with scattered colonic diverticulosis along the sigmoid colon. No evidence of acute diverticulitis. Vascular/Lymphatic: Aortoiliac atherosclerosis without aneurysm. No lymphadenopathy. Reproductive: Normal prostate and seminal vesicles. Other: Fat noted in the right inguinal canal with evidence of prior left inguinal hernia repair. Musculoskeletal: Mild thoracic spondylosis with osteophytes anteriorly. No acute nor suspicious osseous abnormality. Moderate-to-marked lower lumbar facet arthropathy with joint space narrowing and sclerosis most evident L4 through S1. IMPRESSION: 1. Moderate distention of the gallbladder with wall thickening and cholelithiasis. Cholecystitis is not excluded. 2. Slightly complex fluid overlying the right hepatic lobe may represent a subcapsular abscess or possibly hematoma. This measures approximately  11.4 x 3.7 x 8.5 cm. 3. Small right pleural effusion with adjacent atelectasis. 4. Too small to characterize 8 mm  right upper pole hypodensity statistically consistent with a cyst. Presently no worrisome features. 5. Fat seen in the right inguinal canal that may represent herniation of properitoneal fat. Status post left inguinal hernia repair. 6. Lower lumbar facet arthropathy. Electronically Signed   By: Ashley Royalty M.D.   On: 01/17/2017 20:52   Ir Guided Niel Hummer W Catheter Placement  Result Date: 01/18/2017 INDICATION: Perihepatic Abscess.  Acute calculus cholecystitis. EXAM: ABSCESS DRAINAGE.  CHOLECYSTOSTOMY. MEDICATIONS: None ANESTHESIA/SEDATION: Fentanyl 25 mcg IV; Versed 1 mg IV Moderate Sedation Time:  15 The patient was continuously monitored during the procedure by the interventional radiology nurse under my direct supervision. FLUOROSCOPY TIME:  Fluoroscopy Time: 3 minutes  seconds ( mGy). COMPLICATIONS: None immediate. PROCEDURE: Informed written consent was obtained from the patient after a thorough discussion of the procedural risks, benefits and alternatives. All questions were addressed. Maximal Sterile Barrier Technique was utilized including caps, mask, sterile gowns, sterile gloves, sterile drape, hand hygiene and skin antiseptic. A timeout was performed prior to the initiation of the procedure. The right upper quadrant was prepped with ChloraPrep in a sterile fashion, and a sterile drape was applied covering the operative field. A sterile gown and sterile gloves were used for the procedure. Under sonographic guidance, a 21 gauge needle was inserted into the perihepatic abscess and removed over a 018 wire which was up sized to a 3 J. A 10 French dilator followed by a 10 [place] drain were inserted. It was looped and string fixed then sewn to the skin. Frank pus was aspirated. Subsequently, a 21 gauge needle was inserted into the gallbladder lumen under sonographic guidance and via transhepatic  approach. It was removed over a 018 wire, which was upsized to a 3-J. A 10-French drain was advanced over the wire and coiled in the gallbladder lumen. It was sewn to the skin after being string fixed. FINDINGS: Imaging demonstrates 58 French drain placement into a perihepatic abscess. Subsequent imaging demonstrates 10 French drain placement into the lumen of the gallbladder. The cystic duct is occluded. IMPRESSION: Successful cholecystostomy and perihepatic abscess drain. The cholecystostomy tube needs to remain in place at least 6 weeks. Electronically Signed   By: Marybelle Killings M.D.   On: 01/18/2017 11:06   US Abdomen Limited Ruq  Result Date: 01/17/2017 CLINICAL DATA:  Right upper quadrant pain for 5 days. EXAM: ULTRASOUND ABDOMEN LIMITED RIGHT UPPER QUADRANT COMPARISON:  Abdominal ultrasound 09/01/2008. FINDINGS: Gallbladder: A large volume of sludge is present within the gallbladder. Stones are identified measuring up to 1.1 cm and the gallbladder wall is thickened at 0.5 cm. There is a small amount pericholecystic fluid. Sonographer reports negative Murphy's sign. Common bile duct: Diameter: 0.3 cm Liver: Echogenicity is increased and echotexture is coarsened. There is a fluid collection along the anterior aspect of the right hepatic lobe which contains debris and measures 7.2 x 2.4 x 6.0 cm. This is not seen on the prior exam. IMPRESSION: Large fluid collection along the anterior right hepatic lobe cannot be definitively characterized but could be an abscess. CT abdomen and pelvis with contrast is recommended for further evaluation. Large volume of gallbladder sludge and stones with wall thickening worrisome for acute cholecystitis. These results will be called to the ordering clinician or representative by the Radiologist Assistant, and communication documented in the PACS or zVision Dashboard. Electronically Signed   By: Inge Rise M.D.   On: 01/17/2017 14:21     Anti-infectives: Anti-infectives    Start  Dose/Rate Route Frequency Ordered Stop   01/19/17 1400  metroNIDAZOLE (FLAGYL) tablet 500 mg     500 mg Oral Every 8 hours 01/19/17 1216     01/19/17 1230  ciprofloxacin (CIPRO) tablet 500 mg     500 mg Oral 2 times daily 01/19/17 1216     01/18/17 0400  piperacillin-tazobactam (ZOSYN) IVPB 3.375 g  Status:  Discontinued     3.375 g 12.5 mL/hr over 240 Minutes Intravenous Every 8 hours 01/17/17 2355 01/19/17 1216   01/17/17 1930  piperacillin-tazobactam (ZOSYN) IVPB 3.375 g     3.375 g 100 mL/hr over 30 Minutes Intravenous  Once 01/17/17 1924 01/17/17 2034      Assessment/Plan: Subcapsular abscess and cholecystitis status post drain placement and cholecystostomy tube Doing very well Transition to by mouth antibiotics Continue drains No surgical intervention at this time Discharged in the morning  Caroleen Hamman, MD, FACS  01/19/2017

## 2017-01-20 ENCOUNTER — Other Ambulatory Visit: Payer: Medicare HMO

## 2017-01-20 ENCOUNTER — Other Ambulatory Visit: Payer: Self-pay | Admitting: Family Medicine

## 2017-01-20 DIAGNOSIS — K769 Liver disease, unspecified: Secondary | ICD-10-CM

## 2017-01-20 DIAGNOSIS — R911 Solitary pulmonary nodule: Secondary | ICD-10-CM

## 2017-01-20 LAB — COMPREHENSIVE METABOLIC PANEL
ALBUMIN: 2.4 g/dL — AB (ref 3.5–5.0)
ALK PHOS: 93 U/L (ref 38–126)
ALT: 20 U/L (ref 17–63)
ANION GAP: 9 (ref 5–15)
AST: 23 U/L (ref 15–41)
BILIRUBIN TOTAL: 0.6 mg/dL (ref 0.3–1.2)
BUN: 22 mg/dL — AB (ref 6–20)
CALCIUM: 8.4 mg/dL — AB (ref 8.9–10.3)
CO2: 24 mmol/L (ref 22–32)
CREATININE: 1.19 mg/dL (ref 0.61–1.24)
Chloride: 108 mmol/L (ref 101–111)
GFR calc Af Amer: >60 mL/min (ref >60)
GFR calc non Af Amer: 54 mL/min — ABNORMAL LOW (ref >60)
GLUCOSE: 141 mg/dL — AB (ref 65–99)
Potassium: 3.7 mmol/L (ref 3.5–5.1)
Sodium: 141 mmol/L (ref 135–145)
TOTAL PROTEIN: 6.6 g/dL (ref 6.5–8.1)

## 2017-01-20 LAB — CBC
HEMATOCRIT: 31.8 % — AB (ref 40.0–52.0)
HEMOGLOBIN: 10.7 g/dL — AB (ref 13.0–18.0)
MCH: 28.6 pg (ref 26.0–34.0)
MCHC: 33.8 g/dL (ref 32.0–36.0)
MCV: 84.6 fL (ref 80.0–100.0)
Platelets: 238 10*3/uL (ref 150–440)
RBC: 3.76 MIL/uL — ABNORMAL LOW (ref 4.40–5.90)
RDW: 17.7 % — ABNORMAL HIGH (ref 11.5–14.5)
WBC: 7.9 10*3/uL (ref 3.8–10.6)

## 2017-01-20 LAB — GLUCOSE, CAPILLARY
GLUCOSE-CAPILLARY: 205 mg/dL — AB (ref 65–99)
GLUCOSE-CAPILLARY: 240 mg/dL — AB (ref 65–99)
GLUCOSE-CAPILLARY: 180 mg/dL — AB (ref 65–99)
GLUCOSE-CAPILLARY: 199 mg/dL — AB (ref 65–99)
GLUCOSE-CAPILLARY: 175 mg/dL — AB (ref 65–99)
Glucose-Capillary: 183 mg/dL — ABNORMAL HIGH (ref 65–99)
Glucose-Capillary: 162 mg/dL — ABNORMAL HIGH (ref 65–99)
Glucose-Capillary: 235 mg/dL — ABNORMAL HIGH (ref 65–99)
Glucose-Capillary: 160 mg/dL — ABNORMAL HIGH (ref 65–99)
Glucose-Capillary: 133 mg/dL — ABNORMAL HIGH (ref 65–99)
Glucose-Capillary: 142 mg/dL — ABNORMAL HIGH (ref 65–99)

## 2017-01-20 MED ORDER — HYDROCODONE-ACETAMINOPHEN 5-325 MG PO TABS: 1.0000 | ORAL_TABLET | Freq: Four times a day (QID) | ORAL | 0 refills | Status: DC | PRN

## 2017-01-20 MED ORDER — LEVOFLOXACIN 500 MG PO TABS: 500.0000 mg | ORAL_TABLET | Freq: Every day | ORAL | 0 refills | Status: AC

## 2017-01-20 NOTE — Progress Notes (Signed)
IV was removed. Discharge instructions, follow-up appointments, and prescriptions were provided to the pt and wife at bedside. All questions were answered. Teaching was provided for care of the drain per discharge instructions and MD order. The pt was taken downstairs via wheelchair by [employer].

## 2017-01-20 NOTE — Progress Notes (Signed)
[place] rounding unit visited with pt. Pt was sitting on chair with his wife beside him. Pt stated he was a church person and that his Sunday School teacher had visited him this morning. Pt was calm and optimistic about the prospect of being discharged this afternoon. Pt requested for prayers for healing, which [place] offered to pt and his wife.   01/20/17 1100  Clinical Encounter Type  Visited With Patient and family together  Visit Type Initial;Spiritual support  Referral From [place];Other (Comment)

## 2017-01-20 NOTE — Discharge Instructions (Addendum)
In addition to included general instructions for Cholecystostomy (Gallbladder) Drain,  Diet: Resume home heart healthy diet.   Activity: No heavy lifting >20 pounds (children, pets, laundry, garbage) or strenuous activity until follow-up, but light activity and walking are encouraged. Do not drive or drink alcohol if taking narcotic pain medications.  Wound care: Keep cholecystostomy (gallbladder) drain clean and dry as directed by Interventional Radiology and RN. No baths or submerging drain underwater. Call Interventional Radiology office or Surgery if abdominal pain increases significantly and drainage substantially decreases (suggestive of drain dysfunction).  Medications: Resume all home medications. For mild to moderate pain: acetaminophen (Tylenol) or ibuprofen (if no kidney disease). Combining Tylenol with alcohol can substantially increase your risk of causing liver disease. Narcotic pain medications, if prescribed, can be used for severe pain, though may cause nausea, constipation, and drowsiness. Do not combine Tylenol and Percocet within a 6 hour period as Percocet contains Tylenol. If you do not need the narcotic pain medication, you do not need to fill the prescription.  Call office [phone]) at any time if any questions, worsening pain, fevers/chills, bleeding, drainage from incision site, or other concerns.

## 2017-01-20 NOTE — Care Management Important Message (Signed)
Important Message  Patient Details  Name: Benjamin Austin MRN: [id number] Date of Birth: [birth year]   Medicare Important Message Given:  Yes    Beverly Sessions, RN 01/20/2017, 2:26 PM

## 2017-01-21 ENCOUNTER — Other Ambulatory Visit: Payer: Self-pay | Admitting: Surgery

## 2017-01-21 DIAGNOSIS — K650 Generalized (acute) peritonitis: Secondary | ICD-10-CM

## 2017-01-21 DIAGNOSIS — K668 Other specified disorders of peritoneum: Secondary | ICD-10-CM

## 2017-01-22 LAB — CULTURE, BLOOD (ROUTINE X 2)
CULTURE: NO GROWTH
Culture: NO GROWTH

## 2017-01-23 LAB — AEROBIC/ANAEROBIC CULTURE W GRAM STAIN (SURGICAL/DEEP WOUND)

## 2017-01-23 LAB — AEROBIC/ANAEROBIC CULTURE (SURGICAL/DEEP WOUND)

## 2017-01-24 NOTE — Discharge Summary (Signed)
Physician Discharge Summary  Patient ID: Benjamin Austin MRN: [id number] DOB/AGE: 01-12-1931 83 y.o.  Admit date: 01/17/2017 Discharge date: 01/24/2017  Admission Diagnoses:  Discharge Diagnoses:  Active Problems:   RUQ pain   Acute cholecystitis   Discharged Condition: fair  Hospital Course: 83 year old Male presented [facility] ED with RUQ and was found upon workup to have acute cholecystitis along with subcapsular liver abscess, for which he underwent image-guided placement of percutaneous cholecystostomy drain. The remainder of patient's hospital admission was uneventful with resolution of patient's leukocytosis and pain with patient tolerating advancement of his diet and activity. Accordingly, discharge planning was initiated, and patient was safely able to be discharged home with appropriate discharge instructions and follow-up.  Consults: Interventional Radiology  Significant Diagnostic Studies: radiology: CT scan: Right liver abscess with acute calculous cholecystitis and Ultrasound: Right liver abscess with acute calculous cholecystitis  Treatments: Interventional Radiology: placement of percutaneous cholecystostomy drain  Discharge Exam: Blood pressure (!) 149/67, pulse 72, temperature 97.9 F (36.6 C), temperature source Oral, resp. rate 17, height 5\' 8"  (0.867 m), weight 158 lb 9.6 oz (71.9 kg), SpO2 97 %. General appearance: alert, cooperative and no distress GI: abdomen soft and non-distended with mild RUQ/flank peri-cather tenderness to palpation surrounding well-secured cholecystostomy drain, no surrounding erythema or drainage  Disposition: 01-Home or Self Care   Allergies as of 01/20/2017   No Known Allergies     Medication List    TAKE these medications   Calcium Carbonate-Vitamin D 600-400 MG-UNIT tablet Take 1 tablet by mouth daily.   HYDROcodone-acetaminophen 5-325 MG tablet Commonly known as:  NORCO/VICODIN Take 1-2 tablets by mouth every 6 (six) hours as  needed for moderate pain.   levofloxacin 500 MG tablet Commonly known as:  LEVAQUIN Take 1 tablet (500 mg total) by mouth daily.   lisinopril 20 MG tablet Commonly known as:  PRINIVIL,ZESTRIL Take 20 mg by mouth daily.   metFORMIN 1000 MG tablet Commonly known as:  GLUCOPHAGE Take 1,000 mg by mouth 2 (two) times daily.   Omega-3 1000 MG Caps Take 1,000 mg by mouth daily.   pravastatin 20 MG tablet Commonly known as:  PRAVACHOL Take 10 mg by mouth at bedtime.      Follow-up [place]. Go on 02/03/2017.   Specialty:  General Surgery Why:  Dr. Dahlia Byes, Monday, August 27 at 11:00 a.m.  [phone] Contact information: [place] [phone]          Signed: Vickie Epley 01/24/2017, 6:54 PM

## 2017-01-28 ENCOUNTER — Other Ambulatory Visit: Payer: Self-pay

## 2017-01-28 DIAGNOSIS — K219 Gastro-esophageal reflux disease without esophagitis: Secondary | ICD-10-CM | POA: Insufficient documentation

## 2017-01-28 DIAGNOSIS — E782 Mixed hyperlipidemia: Secondary | ICD-10-CM | POA: Insufficient documentation

## 2017-01-28 DIAGNOSIS — E119 Type 2 diabetes mellitus without complications: Secondary | ICD-10-CM | POA: Insufficient documentation

## 2017-01-28 DIAGNOSIS — E538 Deficiency of other specified B group vitamins: Secondary | ICD-10-CM | POA: Insufficient documentation

## 2017-01-29 ENCOUNTER — Telehealth: Payer: Self-pay | Admitting: General Practice

## 2017-01-29 DIAGNOSIS — K810 Acute cholecystitis: Secondary | ICD-10-CM | POA: Diagnosis not present

## 2017-01-29 DIAGNOSIS — K650 Generalized (acute) peritonitis: Secondary | ICD-10-CM | POA: Diagnosis not present

## 2017-01-29 NOTE — Telephone Encounter (Signed)
Patient wife called said , she got a call that said her husband had an appointment tomorrow for imaging in [place], patient isn't sure who schedule this appointment please call patient and advice.

## 2017-01-29 NOTE — Telephone Encounter (Signed)
Called patient back and spoke with his wife-Benjamin Austin. I told her that to answer her question about an appointment at the [place] [facility], I did not see an appointment in her husband's chart. I also told patient that I looked into Care Everywhere and I did not see that any of her husband's doctor's from [facility] did not order any images either. I then asked her if they had left her a message. She stated that they did. So I asked her if she could please give them a call and ask if he does have an appointment and who was the provider that ordered the imaging. Benjamin Austin stated that she would do that.  I reminded her of an appointment that [name] has with Dr. Dahlia Byes on 02/03/2017 at 11:00 AM. She stated that she would bring her husband.

## 2017-01-30 ENCOUNTER — Other Ambulatory Visit: Payer: Medicare HMO

## 2017-01-30 ENCOUNTER — Inpatient Hospital Stay: Admission: RE | Admit: 2017-01-30 | Payer: Medicare HMO | Source: Ambulatory Visit

## 2017-02-03 ENCOUNTER — Encounter: Payer: Self-pay | Admitting: Surgery

## 2017-02-03 ENCOUNTER — Ambulatory Visit (INDEPENDENT_AMBULATORY_CARE_PROVIDER_SITE_OTHER): Payer: Medicare HMO | Admitting: Surgery

## 2017-02-03 VITALS — BP 120/74 | HR 88 | Temp 98.4°F | Ht 68.0 in | Wt 156.4 lb

## 2017-02-03 DIAGNOSIS — K769 Liver disease, unspecified: Secondary | ICD-10-CM

## 2017-02-03 DIAGNOSIS — K810 Acute cholecystitis: Secondary | ICD-10-CM | POA: Diagnosis not present

## 2017-02-03 DIAGNOSIS — E538 Deficiency of other specified B group vitamins: Secondary | ICD-10-CM | POA: Diagnosis not present

## 2017-02-03 NOTE — Patient Instructions (Addendum)
We have scheduled a Cholangiogram Test (Radiology) through the drain tube that you currently have. Our office with call you as soon as we have the details of this appointment. You should hear from our office in 1-2 business days regarding this testing.  If you take any of the following medications and we are unaware, please let our office know: Coumadin, Aspirin, Ticlid, Plavix, Eliquis, Xarelto, Pletal or Aggrenox.   If you are taking any Metformin containing agents then they need to be off of them the day of the exam & 48 hours post exam.    Please have clear liquid meal the night before testing, then Nothing by mouth after midnight.  The day of your testing, report to the [place] at [facility] and sign in at the Registration desk. This is the first desk on your right hand side after entering the medical mall entrance.  We will also set-up home health for your dressing changes and drain flushes every other day. When the nurse notifies you that she is on her way- go ahead and take your shower. Wash with soap and water but do not submerge in a bathtub until told to do so by your surgeon.  We will see you back in the office after this testing has been done. I will let you know as soon as I get this testing and follow-up appointment scheduled.  Please call our office with any questions or concerns, call our office and speak with a nurse.

## 2017-02-03 NOTE — Progress Notes (Signed)
Outpatient Surgical Follow Up  02/03/2017  Benjamin Austin is an 83 y.o. male.   Chief Complaint  Patient presents with  . Hospitalization Follow-up    Acute Cholecystitis with Cholecystostomy Drain Placement (01/18/17)    HPI: Benjamin Austin is an 83 year old male being followed up after a recent episode of acute cholecystitis and right upper quadrant abscess requiring both cholecystostomy tube and drain placement. He has done remarkably well, denies any pain or any fevers , + PO adequately. Minimal drainage from right upper quadrant drain. His cholecystostomy tube is widely patent. HE is about to complete full course of A/Bs  Past Medical History:  Diagnosis Date  . Arthritis   . Diabetes mellitus without complication ([place])   . Hypertension     Past Surgical History:  Procedure Laterality Date  . APPENDECTOMY    . HERNIA REPAIR    . IR GUIDED DRAIN W CATHETER PLACEMENT  01/18/2017    Family History  Problem Relation Age of Onset  . Heart disease Father     Social History:  reports that he has been smoking Cigars.  He has never used smokeless tobacco. He reports that he does not drink alcohol or use drugs.  Allergies: No Known Allergies  Medications reviewed.    ROS Full ROS performed and is otherwise negative other than what is stated in HPI   BP 120/74   Pulse 88   Temp 98.4 F (36.9 C) (Oral)   Ht 5\' 8"  (1.727 m)   Wt 70.9 kg (156 lb 6.4 oz)   BMI 23.78 kg/m   Physical Exam  Constitutional: He is oriented to person, place, and time. No distress.  Neck: No JVD present. No tracheal deviation present.  Pulmonary/Chest: Effort normal. No stridor. No respiratory distress. He exhibits no tenderness.  Abdominal: Soft. He exhibits no distension and no mass. There is no tenderness. There is no rebound and no guarding.  RUQ perihepatic drain removed. Cholecystostomy tube in place w dark bile.  Neurological: He is alert and oriented to person, place, and time. Gait normal.  GCS score is 15.  Skin: Skin is warm and dry. He is not diaphoretic.  Psychiatric: Mood, memory, affect and judgment normal.  Nursing note and vitals reviewed.      No results found for this or any previous visit (from the past 48 hour(s)). No results found.  Assessment/Plan: Doing well, we will obtain a cholangiogram and see him back in a few weeks. I do think that is reasonable to perform a cholecystectomy once he has recovered from his infectious process.  No need for emergent intervention at this time  1. Cholecystitis, acute - DG CHOLANGIOGRAM  EXISTING TUBE; Future    Caroleen Hamman, MD FACS General Surgeon

## 2017-02-04 ENCOUNTER — Telehealth: Payer: Self-pay

## 2017-02-04 DIAGNOSIS — K810 Acute cholecystitis: Secondary | ICD-10-CM

## 2017-02-04 NOTE — Telephone Encounter (Signed)
Referral, orders, demographics, and insurance card faxed to [facility] at this time.

## 2017-02-04 NOTE — Telephone Encounter (Signed)
Orders placed for Cholangiogram.  Call made to Specialty scheduling at this time to schedule T-Tube Cholangiogram. Message left on both specialty scheduling line and [facility] line. Awaiting return phone call.

## 2017-02-04 NOTE — Telephone Encounter (Signed)
Called and spoke with Benjamin Austin to let her know when her husband's T-Tube Cholangiogram was scheduled on. Benjamin Austin was told to stop her husband's Metformin the day of and 48 hours after his procedure. She understood and had no further questions about this.  Benjamin Austin wants to know when will [facility] give her a call so they could come to their house Can you please check for her and give her a call. Thank you.

## 2017-02-04 NOTE — Telephone Encounter (Signed)
Call made to Central scheduling once again. No answer. Left voicemail for return phone call.

## 2017-02-04 NOTE — Telephone Encounter (Signed)
Cholangiogram is 9/4 at 1130am  Follow-up with Dr. Dahlia Byes has been scheduled for 02/27/17. Reminder letter for both testing and appointment were sent to home address.

## 2017-02-05 DIAGNOSIS — E119 Type 2 diabetes mellitus without complications: Secondary | ICD-10-CM | POA: Diagnosis not present

## 2017-02-05 DIAGNOSIS — I10 Essential (primary) hypertension: Secondary | ICD-10-CM | POA: Diagnosis not present

## 2017-02-05 DIAGNOSIS — Z48815 Encounter for surgical aftercare following surgery on the digestive system: Secondary | ICD-10-CM | POA: Diagnosis not present

## 2017-02-05 DIAGNOSIS — Z7984 Long term (current) use of oral hypoglycemic drugs: Secondary | ICD-10-CM | POA: Diagnosis not present

## 2017-02-05 NOTE — Telephone Encounter (Signed)
Spoke with Joelene Millin from [facility] and patient is to be seen for 1st visit, this afternoon.

## 2017-02-07 DIAGNOSIS — Z7984 Long term (current) use of oral hypoglycemic drugs: Secondary | ICD-10-CM | POA: Diagnosis not present

## 2017-02-07 DIAGNOSIS — E119 Type 2 diabetes mellitus without complications: Secondary | ICD-10-CM | POA: Diagnosis not present

## 2017-02-07 DIAGNOSIS — I10 Essential (primary) hypertension: Secondary | ICD-10-CM | POA: Diagnosis not present

## 2017-02-07 DIAGNOSIS — Z48815 Encounter for surgical aftercare following surgery on the digestive system: Secondary | ICD-10-CM | POA: Diagnosis not present

## 2017-02-10 DIAGNOSIS — Z7984 Long term (current) use of oral hypoglycemic drugs: Secondary | ICD-10-CM | POA: Diagnosis not present

## 2017-02-10 DIAGNOSIS — E119 Type 2 diabetes mellitus without complications: Secondary | ICD-10-CM | POA: Diagnosis not present

## 2017-02-10 DIAGNOSIS — I10 Essential (primary) hypertension: Secondary | ICD-10-CM | POA: Diagnosis not present

## 2017-02-10 DIAGNOSIS — Z48815 Encounter for surgical aftercare following surgery on the digestive system: Secondary | ICD-10-CM | POA: Diagnosis not present

## 2017-02-11 ENCOUNTER — Ambulatory Visit (HOSPITAL_COMMUNITY): Payer: Medicare HMO

## 2017-02-11 ENCOUNTER — Ambulatory Visit
Admission: RE | Admit: 2017-02-11 | Discharge: 2017-02-11 | Disposition: A | Discharge status: Home / Self Care | Payer: Medicare HMO | Source: Ambulatory Visit | Attending: Surgery | Admitting: Surgery

## 2017-02-11 ENCOUNTER — Ambulatory Visit
Admission: RE | Admit: 2017-02-11 | Discharge: 2017-02-11 | Disposition: A | Discharge status: Home / Self Care | Payer: Medicare HMO | Source: Ambulatory Visit | Attending: Family Medicine | Admitting: Family Medicine

## 2017-02-11 DIAGNOSIS — Z77090 Contact with and (suspected) exposure to asbestos: Secondary | ICD-10-CM | POA: Diagnosis not present

## 2017-02-11 DIAGNOSIS — I700 Atherosclerosis of aorta: Secondary | ICD-10-CM | POA: Diagnosis not present

## 2017-02-11 DIAGNOSIS — M47814 Spondylosis without myelopathy or radiculopathy, thoracic region: Secondary | ICD-10-CM | POA: Diagnosis not present

## 2017-02-11 DIAGNOSIS — R918 Other nonspecific abnormal finding of lung field: Secondary | ICD-10-CM | POA: Diagnosis not present

## 2017-02-11 DIAGNOSIS — I2510 Atherosclerotic heart disease of native coronary artery without angina pectoris: Secondary | ICD-10-CM | POA: Insufficient documentation

## 2017-02-11 DIAGNOSIS — Z9049 Acquired absence of other specified parts of digestive tract: Secondary | ICD-10-CM | POA: Diagnosis not present

## 2017-02-11 DIAGNOSIS — Z09 Encounter for follow-up examination after completed treatment for conditions other than malignant neoplasm: Secondary | ICD-10-CM | POA: Diagnosis not present

## 2017-02-11 DIAGNOSIS — R911 Solitary pulmonary nodule: Secondary | ICD-10-CM

## 2017-02-11 DIAGNOSIS — Z4803 Encounter for change or removal of drains: Secondary | ICD-10-CM | POA: Insufficient documentation

## 2017-02-11 DIAGNOSIS — Z434 Encounter for attention to other artificial openings of digestive tract: Secondary | ICD-10-CM | POA: Diagnosis not present

## 2017-02-11 DIAGNOSIS — K810 Acute cholecystitis: Secondary | ICD-10-CM

## 2017-02-11 MED ORDER — IOPAMIDOL (ISOVUE-300) INJECTION 61%
50.0000 mL | Freq: Once | INTRAVENOUS | Status: AC | PRN
  Administered 2017-02-11: 20 mL

## 2017-02-12 ENCOUNTER — Ambulatory Visit: Payer: Medicare HMO

## 2017-02-12 ENCOUNTER — Telehealth: Payer: Self-pay

## 2017-02-12 DIAGNOSIS — I10 Essential (primary) hypertension: Secondary | ICD-10-CM | POA: Diagnosis not present

## 2017-02-12 DIAGNOSIS — E119 Type 2 diabetes mellitus without complications: Secondary | ICD-10-CM | POA: Diagnosis not present

## 2017-02-12 DIAGNOSIS — Z48815 Encounter for surgical aftercare following surgery on the digestive system: Secondary | ICD-10-CM | POA: Diagnosis not present

## 2017-02-12 DIAGNOSIS — Z7984 Long term (current) use of oral hypoglycemic drugs: Secondary | ICD-10-CM | POA: Diagnosis not present

## 2017-02-12 NOTE — Telephone Encounter (Signed)
Reviewed results of Cholangiogram with Dr. Hampton Abbot (Surgeon on call). No further testing needed prior to Dr/ Pabon seeing patient at upcoming appointment. Call was made to patient and results were reviewed with his wife. I explained that plan going forward would be seeing Dr. Dahlia Byes to discuss surgery and possibly needing to have an additional procedure with GI Physician to remove stones at end of common bile duct. Encouraged patient to keep appointment as scheduled.  Will speak with Dr. Dahlia Byes when available and return phone call if anything additional is needed prior to appointment.

## 2017-02-14 DIAGNOSIS — E119 Type 2 diabetes mellitus without complications: Secondary | ICD-10-CM | POA: Diagnosis not present

## 2017-02-14 DIAGNOSIS — Z48815 Encounter for surgical aftercare following surgery on the digestive system: Secondary | ICD-10-CM | POA: Diagnosis not present

## 2017-02-14 DIAGNOSIS — I10 Essential (primary) hypertension: Secondary | ICD-10-CM | POA: Diagnosis not present

## 2017-02-14 DIAGNOSIS — Z7984 Long term (current) use of oral hypoglycemic drugs: Secondary | ICD-10-CM | POA: Diagnosis not present

## 2017-02-17 DIAGNOSIS — I10 Essential (primary) hypertension: Secondary | ICD-10-CM | POA: Diagnosis not present

## 2017-02-17 DIAGNOSIS — E119 Type 2 diabetes mellitus without complications: Secondary | ICD-10-CM | POA: Diagnosis not present

## 2017-02-17 DIAGNOSIS — Z48815 Encounter for surgical aftercare following surgery on the digestive system: Secondary | ICD-10-CM | POA: Diagnosis not present

## 2017-02-17 DIAGNOSIS — Z7984 Long term (current) use of oral hypoglycemic drugs: Secondary | ICD-10-CM | POA: Diagnosis not present

## 2017-02-19 DIAGNOSIS — Z48815 Encounter for surgical aftercare following surgery on the digestive system: Secondary | ICD-10-CM | POA: Diagnosis not present

## 2017-02-19 DIAGNOSIS — E119 Type 2 diabetes mellitus without complications: Secondary | ICD-10-CM | POA: Diagnosis not present

## 2017-02-19 DIAGNOSIS — I10 Essential (primary) hypertension: Secondary | ICD-10-CM | POA: Diagnosis not present

## 2017-02-19 DIAGNOSIS — Z7984 Long term (current) use of oral hypoglycemic drugs: Secondary | ICD-10-CM | POA: Diagnosis not present

## 2017-02-21 DIAGNOSIS — Z7984 Long term (current) use of oral hypoglycemic drugs: Secondary | ICD-10-CM | POA: Diagnosis not present

## 2017-02-21 DIAGNOSIS — E119 Type 2 diabetes mellitus without complications: Secondary | ICD-10-CM | POA: Diagnosis not present

## 2017-02-21 DIAGNOSIS — Z48815 Encounter for surgical aftercare following surgery on the digestive system: Secondary | ICD-10-CM | POA: Diagnosis not present

## 2017-02-21 DIAGNOSIS — I10 Essential (primary) hypertension: Secondary | ICD-10-CM | POA: Diagnosis not present

## 2017-02-25 DIAGNOSIS — Z48815 Encounter for surgical aftercare following surgery on the digestive system: Secondary | ICD-10-CM | POA: Diagnosis not present

## 2017-02-25 DIAGNOSIS — I10 Essential (primary) hypertension: Secondary | ICD-10-CM | POA: Diagnosis not present

## 2017-02-25 DIAGNOSIS — Z7984 Long term (current) use of oral hypoglycemic drugs: Secondary | ICD-10-CM | POA: Diagnosis not present

## 2017-02-25 DIAGNOSIS — E119 Type 2 diabetes mellitus without complications: Secondary | ICD-10-CM | POA: Diagnosis not present

## 2017-02-26 ENCOUNTER — Other Ambulatory Visit: Payer: Self-pay

## 2017-02-26 DIAGNOSIS — Z7984 Long term (current) use of oral hypoglycemic drugs: Secondary | ICD-10-CM | POA: Diagnosis not present

## 2017-02-26 DIAGNOSIS — Z48815 Encounter for surgical aftercare following surgery on the digestive system: Secondary | ICD-10-CM | POA: Diagnosis not present

## 2017-02-26 DIAGNOSIS — E119 Type 2 diabetes mellitus without complications: Secondary | ICD-10-CM | POA: Diagnosis not present

## 2017-02-26 DIAGNOSIS — I10 Essential (primary) hypertension: Secondary | ICD-10-CM | POA: Diagnosis not present

## 2017-02-26 DIAGNOSIS — K8020 Calculus of gallbladder without cholecystitis without obstruction: Secondary | ICD-10-CM

## 2017-02-27 ENCOUNTER — Ambulatory Visit (INDEPENDENT_AMBULATORY_CARE_PROVIDER_SITE_OTHER): Payer: Medicare HMO | Admitting: Surgery

## 2017-02-27 ENCOUNTER — Encounter: Payer: Self-pay | Admitting: Surgery

## 2017-02-27 VITALS — BP 121/75 | HR 81 | Temp 97.5°F | Ht 68.0 in | Wt 155.0 lb

## 2017-02-27 DIAGNOSIS — K8020 Calculus of gallbladder without cholecystitis without obstruction: Secondary | ICD-10-CM

## 2017-02-27 NOTE — Patient Instructions (Signed)
We have scheduled you for an ERCP to have the stones in your common bile duct removed. This will be done by Dr. Allen Norris on Tuesday, 03/04/17 at [facility].  You will need to call [phone] between 1-3pm, the day prior to your procedure.  This is a same day procedure and you will go home that afternoon. However, you will need a driver to check in with you, stay the entire time, and take you home afterwards.  You may not have anything to eat or drink after midnight prior to your procedure.    Endoscopic Retrograde Cholangiopancreatogram Endoscopic retrograde cholangiopancreatogram (ERCP) is a procedure that may be used to diagnose or treat problems with the pancreas, bile ducts, liver, and gallbladder. For this procedure, a thin, lighted tube (endoscope) is passed through the mouth, the throat, and down into the areas being checked. The endoscope has a camera that allows the areas to be viewed. Dye is injected and then X-rays are taken to further study the areas. During ERCP, other procedures may also be done to help diagnose or treat problems that are found. For example, stones can be removed, or a tissue sample can be taken out for testing (biopsy). Tell a health care provider about:  Any allergies you have.  All medicines you are taking, including vitamins, herbs, eye drops, creams, and over-the-counter medicines.  Any problems you or family members have had with anesthetic medicines.  Any blood disorders you have.  Any surgeries you have had.  Any medical conditions you have.  Whether you are pregnant or may be pregnant. What are the risks? Generally, this is a safe procedure. However, problems may occur, including:  Pancreatitis.  Infection.  Bleeding.  Allergic reactions to medicines or dyes.  Accidental punctures in the bowel wall, pancreas, or gallbladder.  Damage to other structures or organs.  What happens before the procedure? Staying hydrated Follow instructions from  your health care provider about hydration, which may include:  Up to 2 hours before the procedure - you may continue to drink clear liquids, such as water, clear fruit juice, black coffee, and plain tea.  Eating and drinking restrictions Follow instructions from your health care provider about eating and drinking, which may include:  8 hours before the procedure - stop eating heavy meals or foods such as meat, fried foods, or fatty foods.  6 hours before the procedure - stop eating light meals or foods, such as toast or cereal.  6 hours before the procedure - stop drinking milk or drinks that contain milk.  2 hours before the procedure - stop drinking clear liquids.  General instructions  Ask your health care provider about: ? Changing or stopping your regular medicines. This is especially important if you are taking diabetes medicines or blood thinners. ? Taking medicines such as aspirin and ibuprofen. These medicines can thin your blood. Do not take these medicines before your procedure if your health care provider instructs you not to.  Plan to have someone take you home from the hospital or clinic.  If you will be going home right after the procedure, plan to have someone with you for 24 hours. What happens during the procedure?  To lower your risk of infection, your health care team will wash or sanitize their hands.  An IV tube will be inserted into one of your veins.  You will be given one or more of the following: ? A medicine to help you relax (sedative). ? A medicine to numb the  throat area (local anesthetic) and prevent gagging. Your throat may be sprayed with this medicine, or you may gargle the medicine. ? A medicine to make you fall asleep (general anesthetic). ? A medicine to lower your risk of infection (antibiotic), inflammation (anti-inflammatory), or both.  You will lie on your left side.  The endoscope will be inserted through your mouth, down the back of the  throat, and into the first part of the small intestine (duodenum).  Then a small, plastic tube (cannula) will be passed through the endoscope and directed into the bile duct or pancreatic duct.  Dye will be injected through the cannula to make structures easier to see on an X-ray.  X-rays will be taken to study the biliary and pancreatic passageways. You may be positioned on your abdomen or your back during the X-rays.  A small sample of tissue (biopsy) may be removed for examination, or other procedures may be done to fix problems that are found. The procedure may vary among health care providers and hospitals. What happens after the procedure?  Your blood pressure, heart rate, breathing rate, and blood oxygen level will be monitored until the medicines you were given have worn off.  Your throat may feel slightly sore.  You will not be allowed to eat or drink until numbness subsides.  Do not drive for 24 hours if you were given a sedative. Summary  Endoscopic retrograde cholangiopancreatogram is a procedure that may be used to diagnose or treat problems with the pancreas, bile ducts, liver, and gallbladder.  During ERCP, other procedures may also be done to help diagnose or treat problems that are found. For example, stones can be removed, or a tissue sample can be taken out for testing (biopsy).  Generally, this is a safe procedure. However, problems may occur, including infection, bleeding, pancreatitis, accidental damage to other structures or organs, and allergic reactions to medicines or dyes.  The procedure may vary among health care providers and hospitals. This information is not intended to replace advice given to you by your health care provider. Make sure you discuss any questions you have with your health care provider. Document Released: 02/19/2001 Document Revised: 04/22/2016 Document Reviewed: 04/22/2016 Elsevier Interactive Patient Education  2017 [employer].

## 2017-02-27 NOTE — Progress Notes (Signed)
Outpatient Surgical Follow Up  02/27/2017  Benjamin Austin is an 83 y.o. male.   Chief Complaint  Patient presents with  . Follow-up    Cholangiogram (Scheduled 9/4)    HPI: Benjamin Austin is an 83 year old male with a history of liver abscess and acute cholecystitis requiring a cholecystostomy tube and percutaneous drainage. He had a recent cholangiogram via the tube showing evidence of common bile duct stones. He is doing well. The cholecystomy tube is draining bile. No fevers no chills no evidence of cholangitis no pain. Glandular gram personally reviewed and there is a patent cystic duct and common bile duct but there stones in the distal common bile duct. ERCP is scheduled for this coming Tuesday by Dr.Wohl  Past Medical History:  Diagnosis Date  . Arthritis   . Diabetes mellitus without complication ([place])   . Hypertension     Past Surgical History:  Procedure Laterality Date  . APPENDECTOMY    . HERNIA REPAIR    . IR GUIDED DRAIN W CATHETER PLACEMENT  01/18/2017    Family History  Problem Relation Age of Onset  . Heart disease Father     Social History:  reports that he has been smoking Cigars.  He has never used smokeless tobacco. He reports that he does not drink alcohol or use drugs.  Allergies: No Known Allergies  Medications reviewed.    ROS Full ROS performed and is otherwise negative other than what is stated in HPI   BP 121/75   Pulse 81   Temp (!) 97.5 F (36.4 C) (Oral)   Ht 5\' 8"  (1.727 m)   Wt 70.3 kg (155 lb)   BMI 23.57 kg/m    Physical Exam  Constitutional: He is oriented to person, place, and time and well-developed, well-nourished, and in no distress. No distress.  Neck: Normal range of motion. No JVD present.  Pulmonary/Chest: Effort normal. No stridor. No respiratory distress.  Abdominal: Soft. He exhibits no distension. There is no tenderness. There is no rebound and no guarding.  Musculoskeletal: Normal range of motion. He exhibits no  edema.  Neurological: He is alert and oriented to person, place, and time. GCS score is 15.  Skin: Skin is warm and dry. He is not diaphoretic.  Psychiatric: Mood, memory, affect and judgment normal.  Nursing note and vitals reviewed.   Assessment/Plan: Benjamin Austin is an 83 year old male with a history of liver abscess and acute cholecystitis requiring a cholecystostomy tube and percutaneous drainage. He had a recent cholangiogram via the tube showing evidence of common bile duct stones. D/ with the patient in detail and  he will need an ERCP first. After his duct is cleared we will schedule him for a laparoscopic cholecystectomy. I will see him back in 2 weeks and make sure he does not have any complications related to the ERCP and then schedule an elective cholecystectomy.    Greater than 50% of the 25 minutes  visit was spent in counseling/coordination of care   Caroleen Hamman, MD [place] Surgeon

## 2017-02-28 DIAGNOSIS — Z7984 Long term (current) use of oral hypoglycemic drugs: Secondary | ICD-10-CM | POA: Diagnosis not present

## 2017-02-28 DIAGNOSIS — Z48815 Encounter for surgical aftercare following surgery on the digestive system: Secondary | ICD-10-CM | POA: Diagnosis not present

## 2017-02-28 DIAGNOSIS — I10 Essential (primary) hypertension: Secondary | ICD-10-CM | POA: Diagnosis not present

## 2017-02-28 DIAGNOSIS — E119 Type 2 diabetes mellitus without complications: Secondary | ICD-10-CM | POA: Diagnosis not present

## 2017-03-03 DIAGNOSIS — I10 Essential (primary) hypertension: Secondary | ICD-10-CM | POA: Diagnosis not present

## 2017-03-03 DIAGNOSIS — E119 Type 2 diabetes mellitus without complications: Secondary | ICD-10-CM | POA: Diagnosis not present

## 2017-03-03 DIAGNOSIS — Z48815 Encounter for surgical aftercare following surgery on the digestive system: Secondary | ICD-10-CM | POA: Diagnosis not present

## 2017-03-03 DIAGNOSIS — Z7984 Long term (current) use of oral hypoglycemic drugs: Secondary | ICD-10-CM | POA: Diagnosis not present

## 2017-03-04 ENCOUNTER — Ambulatory Visit: Payer: Medicare HMO

## 2017-03-04 ENCOUNTER — Encounter
Admission: RE | Disposition: A | Discharge status: Home / Self Care | Payer: Self-pay | Source: Ambulatory Visit | Attending: Gastroenterology

## 2017-03-04 ENCOUNTER — Encounter: Payer: Self-pay | Admitting: *Deleted

## 2017-03-04 ENCOUNTER — Ambulatory Visit: Payer: Medicare HMO | Admitting: Certified Registered"

## 2017-03-04 ENCOUNTER — Ambulatory Visit
Admission: RE | Admit: 2017-03-04 | Discharge: 2017-03-04 | Disposition: A | Discharge status: Home / Self Care | Payer: Medicare HMO | Source: Ambulatory Visit | Attending: Gastroenterology | Admitting: Gastroenterology

## 2017-03-04 DIAGNOSIS — Z79899 Other long term (current) drug therapy: Secondary | ICD-10-CM | POA: Diagnosis not present

## 2017-03-04 DIAGNOSIS — K8020 Calculus of gallbladder without cholecystitis without obstruction: Secondary | ICD-10-CM

## 2017-03-04 DIAGNOSIS — E7800 Pure hypercholesterolemia, unspecified: Secondary | ICD-10-CM | POA: Diagnosis not present

## 2017-03-04 DIAGNOSIS — Z87891 Personal history of nicotine dependence: Secondary | ICD-10-CM | POA: Insufficient documentation

## 2017-03-04 DIAGNOSIS — E119 Type 2 diabetes mellitus without complications: Secondary | ICD-10-CM | POA: Diagnosis not present

## 2017-03-04 DIAGNOSIS — I10 Essential (primary) hypertension: Secondary | ICD-10-CM | POA: Diagnosis not present

## 2017-03-04 DIAGNOSIS — Z7984 Long term (current) use of oral hypoglycemic drugs: Secondary | ICD-10-CM | POA: Diagnosis not present

## 2017-03-04 DIAGNOSIS — K8050 Calculus of bile duct without cholangitis or cholecystitis without obstruction: Secondary | ICD-10-CM | POA: Diagnosis not present

## 2017-03-04 HISTORY — PX: ERCP: SHX5425

## 2017-03-04 HISTORY — DX: Pure hypercholesterolemia, unspecified: E78.00

## 2017-03-04 SURGERY — ERCP, WITH INTERVENTION IF INDICATED
Anesthesia: General

## 2017-03-04 MED ORDER — PROPOFOL 500 MG/50ML IV EMUL
INTRAVENOUS | Status: DC | PRN
  Administered 2017-03-04: 100 ug/kg/min via INTRAVENOUS

## 2017-03-04 MED ORDER — INDOMETHACIN 50 MG RE SUPP
RECTAL | Status: DC | PRN
  Administered 2017-03-04: 100 mg via RECTAL

## 2017-03-04 MED ORDER — PROPOFOL 500 MG/50ML IV EMUL
INTRAVENOUS | Status: AC
  Filled 2017-03-04: qty 50

## 2017-03-04 MED ORDER — GLYCOPYRROLATE 0.2 MG/ML IJ SOLN
INTRAMUSCULAR | Status: AC
  Filled 2017-03-04: qty 1

## 2017-03-04 MED ORDER — LIDOCAINE HCL (PF) 2 % IJ SOLN
INTRAMUSCULAR | Status: AC
  Filled 2017-03-04: qty 2

## 2017-03-04 MED ORDER — LIDOCAINE HCL (CARDIAC) 20 MG/ML IV SOLN
INTRAVENOUS | Status: DC | PRN
  Administered 2017-03-04: 40 mg via INTRAVENOUS

## 2017-03-04 MED ORDER — INDOMETHACIN 50 MG RE SUPP: 100.0000 mg | Freq: Once | RECTAL | Status: DC

## 2017-03-04 MED ORDER — INDOMETHACIN 50 MG RE SUPP
RECTAL | Status: AC
  Filled 2017-03-04: qty 2

## 2017-03-04 MED ORDER — GLYCOPYRROLATE 0.2 MG/ML IJ SOLN
INTRAMUSCULAR | Status: DC | PRN
  Administered 2017-03-04: 0.2 mg via INTRAVENOUS

## 2017-03-04 MED ORDER — PROPOFOL 10 MG/ML IV BOLUS
INTRAVENOUS | Status: DC | PRN
  Administered 2017-03-04: 50 mg via INTRAVENOUS

## 2017-03-04 MED ORDER — ONDANSETRON HCL 4 MG/2ML IJ SOLN
INTRAMUSCULAR | Status: AC
  Filled 2017-03-04: qty 2

## 2017-03-04 MED ORDER — ONDANSETRON HCL 4 MG/2ML IJ SOLN
INTRAMUSCULAR | Status: DC | PRN
  Administered 2017-03-04: 4 mg via INTRAVENOUS

## 2017-03-04 MED ORDER — SODIUM CHLORIDE 0.9 % IV SOLN
INTRAVENOUS | Status: DC
  Administered 2017-03-04: 1000 mL via INTRAVENOUS
  Administered 2017-03-04: 11:00:00 via INTRAVENOUS

## 2017-03-04 NOTE — Op Note (Signed)
[facility] Gastroenterology Patient Name: Benjamin Austin Procedure Date: 03/04/2017 11:08 AM MRN: [id number] Account #: [id number] Date of Birth: [birth year] Admit Type: Outpatient Age: 83 Room: [facility] ENDO ROOM 4 Gender: Male Note Status: Finalized Procedure:            ERCP Indications:          Bile duct stone(s) Providers:            Lucilla Lame MD, MD Referring MD:         Dion Body (Referring MD) Medicines:            Propofol per Anesthesia Complications:        No immediate complications. Procedure:            Pre-Anesthesia Assessment:                       - Prior to the procedure, a History and Physical was                        performed, and patient medications and allergies were                        reviewed. The patient's tolerance of previous                        anesthesia was also reviewed. The risks and benefits of                        the procedure and the sedation options and risks were                        discussed with the patient. All questions were                        answered, and informed consent was obtained. Prior                        Anticoagulants: The patient has taken no previous                        anticoagulant or antiplatelet agents. ASA Grade                        Assessment: II - A patient with mild systemic disease.                        After reviewing the risks and benefits, the patient was                        deemed in satisfactory condition to undergo the                        procedure.                       After obtaining informed consent, the scope was passed                        under direct vision. Throughout the procedure, the  patient's blood pressure, pulse, and oxygen saturations                        were monitored continuously. The ERCP was introduced                        through the mouth, and used to inject contrast into and                        used  to inject contrast into the bile duct. The ERCP                        was accomplished without difficulty. The patient                        tolerated the procedure well. Findings:      A scout film of the abdomen was obtained. One percutaneous drain ending       in the Right upper quadrant was seen. The major papilla was located       entirely within a diverticulum. A wire was passed into the biliary tree.       The short-nosed traction sphincterotome was passed over the guidewire       and the bile duct was then deeply cannulated. Contrast was injected. I       personally interpreted the bile duct images. There was brisk flow of       contrast through the ducts. Image quality was excellent. Contrast       extended to the entire biliary tree. The lower third of the main bile       duct contained stone(s) mm. Biliary sphincterotomy was made with a       traction (standard) sphincterotome using ERBE electrocautery. There was       no post-sphincterotomy bleeding. The biliary tree was swept with a 15 mm       balloon starting at the bifurcation. Two stones were removed. No stones       remained. Impression:           - The major papilla was located entirely within a                        diverticulum.                       - Choledocholithiasis was found. Complete removal was                        accomplished by biliary sphincterotomy and balloon                        extraction.                       - A biliary sphincterotomy was performed.                       - The biliary tree was swept. Recommendation:       - Discharge patient to home.                       - Clear liquid diet today, then advance as tolerated. Procedure Code(s):    ---  Professional ---                       [phone], Endoscopic retrograde cholangiopancreatography                        (ERCP); with removal of calculi/debris from                        biliary/pancreatic duct(s)                       43262,  Endoscopic retrograde cholangiopancreatography                        (ERCP); with sphincterotomy/papillotomy                       [phone], Endoscopic catheterization of the biliary ductal                        system, radiological supervision and interpretation Diagnosis Code(s):    --- Professional ---                       K80.50, Calculus of bile duct without cholangitis or                        cholecystitis without obstruction CPT copyright 2016 American Medical Association. All rights reserved. The codes documented in this report are preliminary and upon coder review may  be revised to meet current compliance requirements. Lucilla Lame MD, MD 03/04/2017 11:36:06 AM This report has been signed electronically. Number of Addenda: 0 Note Initiated On: 03/04/2017 11:08 AM      [facility]

## 2017-03-04 NOTE — H&P (Signed)
   Lucilla Lame, MD [address] [place] [place], [place] [address] [phone] Fax : [phone]  Primary Care Physician:  Dion Body, MD Primary Gastroenterologist:  Dr. Allen Norris  Pre-Procedure History & Physical: HPI:  Benjamin Austin is a 83 y.o. male is here for an ERCP.   Past Medical History:  Diagnosis Date  . Arthritis   . Diabetes mellitus without complication ([place])   . Hypercholesteremia   . Hypertension     Past Surgical History:  Procedure Laterality Date  . APPENDECTOMY    . HERNIA REPAIR    . IR GUIDED DRAIN W CATHETER PLACEMENT  01/18/2017    Prior to Admission medications   Medication Sig Start Date End Date Taking? Authorizing Provider  metFORMIN (GLUCOPHAGE) 1000 MG tablet Take 1,000 mg by mouth 2 (two) times daily. 11/07/16  Yes [provider]  Calcium Carbonate-Vitamin D 600-400 MG-UNIT tablet Take 1 tablet by mouth daily.    [provider]  cyanocobalamin (,VITAMIN B-12,) 1000 MCG/ML injection Inject into the muscle. 05/18/14   [provider]  lisinopril (PRINIVIL,ZESTRIL) 20 MG tablet Take 20 mg by mouth daily. 11/07/16   [provider]  Omega-3 1000 MG CAPS Take 1,000 mg by mouth daily.    [provider]  pravastatin (PRAVACHOL) 20 MG tablet Take 10 mg by mouth at bedtime. 11/07/16   [provider]    Allergies as of 02/26/2017  . (No Known Allergies)    Family History  Problem Relation Age of Onset  . Heart disease Father     Social History   Social History  . Marital status: Married    Spouse name: N/A  . Number of children: N/A  . Years of education: N/A   Occupational History  . Not on file.   Social History Main Topics  . Smoking status: Former Smoker    Types: Cigars  . Smokeless tobacco: Never Used  . Alcohol use No  . Drug use: No  . Sexual activity: Not on file   Other Topics Concern  . Not on file   Social History Narrative  . No narrative on  file    Review of Systems: See HPI, otherwise negative ROS  Physical Exam: BP (!) 171/82   Pulse 66   Temp (!) 97.1 F (36.2 C) (Tympanic)   Resp 20   Ht 5\' 8"  (1.727 m)   Wt 155 lb (70.3 kg)   SpO2 100%   BMI 23.57 kg/m  General:   Alert,  pleasant and cooperative in NAD Head:  Normocephalic and atraumatic. Neck:  Supple; no masses or thyromegaly. Lungs:  Clear throughout to auscultation.    Heart:  Regular rate and rhythm. Abdomen:  Soft, nontender and nondistended. Normal bowel sounds, without guarding, and without rebound.   Neurologic:  Alert and  oriented x4;  grossly normal neurologically.  Impression/Plan: Benjamin Austin is here for an ERCP to be performed for CBD stones  Risks, benefits, limitations, and alternatives regarding  ERCP have been reviewed with the patient.  Questions have been answered.  All parties agreeable.   Lucilla Lame, MD  03/04/2017, 10:52 AM

## 2017-03-04 NOTE — Anesthesia Post-op Follow-up Note (Signed)
Anesthesia QCDR form completed.        

## 2017-03-04 NOTE — Anesthesia Postprocedure Evaluation (Signed)
Anesthesia Post Note  Patient: DAKHARI ZUVER  Procedure(s) Performed: Procedure(s) (LRB): ENDOSCOPIC RETROGRADE CHOLANGIOPANCREATOGRAPHY (ERCP) (N/A)  Patient location during evaluation: Endoscopy Anesthesia Type: General Level of consciousness: awake and alert and oriented Pain management: pain level controlled Vital Signs Assessment: post-procedure vital signs reviewed and stable Respiratory status: spontaneous breathing, nonlabored ventilation and respiratory function stable Cardiovascular status: blood pressure returned to baseline and stable Postop Assessment: no signs of nausea or vomiting Anesthetic complications: no     Last Vitals:  Vitals:   03/04/17 1142 03/04/17 1152  BP: (!) 155/96 (!) 176/113  Pulse: 90 85  Resp: 18 (!) 21  Temp: (!) 35.9 C   SpO2: 100% 99%    Last Pain:  Vitals:   03/04/17 1142  TempSrc: Tympanic                 Lee Kuang

## 2017-03-04 NOTE — Anesthesia Preprocedure Evaluation (Signed)
Anesthesia Evaluation  Patient identified by MRN, date of birth, ID band Patient awake    Reviewed: Allergy & Precautions, NPO status , Patient's Chart, lab work & pertinent test results  History of Anesthesia Complications Negative for: history of anesthetic complications  Airway Mallampati: II  TM Distance: >3 FB Neck ROM: Full    Dental  (+) Upper Dentures, Lower Dentures   Pulmonary neg sleep apnea, neg COPD, former smoker,    breath sounds clear to auscultation- rhonchi (-) wheezing      Cardiovascular hypertension, Pt. on medications (-) CAD, (-) Past MI and (-) Cardiac Stents  Rhythm:Regular Rate:Normal - Systolic murmurs and - Diastolic murmurs    Neuro/Psych negative neurological ROS  negative psych ROS   GI/Hepatic Neg liver ROS, GERD  ,  Endo/Other  diabetes, Oral Hypoglycemic Agents  Renal/GU negative Renal ROS     Musculoskeletal  (+) Arthritis ,   Abdominal (+) - obese,   Peds  Hematology negative hematology ROS (+)   Anesthesia Other Findings Past Medical History: No date: Arthritis No date: Diabetes mellitus without complication (HCC) No date: Hypercholesteremia No date: Hypertension   Reproductive/Obstetrics                             Anesthesia Physical Anesthesia Plan  ASA: II  Anesthesia Plan: General   Post-op Pain Management:    Induction: Intravenous  PONV Risk Score and Plan: 1 and Propofol infusion  Airway Management Planned: Natural Airway  Additional Equipment:   Intra-op Plan:   Post-operative Plan:   Informed Consent: I have reviewed the patients History and Physical, chart, labs and discussed the procedure including the risks, benefits and alternatives for the proposed anesthesia with the patient or authorized representative who has indicated his/her understanding and acceptance.   Dental advisory given  Plan Discussed with: CRNA and  Anesthesiologist  Anesthesia Plan Comments:         Anesthesia Quick Evaluation

## 2017-03-04 NOTE — Transfer of Care (Signed)
Immediate Anesthesia Transfer of Care Note  Patient: Benjamin Austin  Procedure(s) Performed: Procedure(s): ENDOSCOPIC RETROGRADE CHOLANGIOPANCREATOGRAPHY (ERCP) (N/A)  Patient Location: Endoscopy Unit  Anesthesia Type:General  Level of Consciousness: awake, oriented and patient cooperative  Airway & Oxygen Therapy: Patient Spontanous Breathing and Patient connected to nasal cannula oxygen  Post-op Assessment: Report given to RN, Post -op Vital signs reviewed and stable and Patient moving all extremities X 4  Post vital signs: Reviewed and stable  Last Vitals:  Vitals:   03/04/17 1028 03/04/17 1142  BP: (!) 171/82 (!) 155/96  Pulse: 66 90  Resp: 20 18  Temp: (!) 36.2 C (!) 35.9 C  SpO2: 100% 100%    Last Pain:  Vitals:   03/04/17 1142  TempSrc: Tympanic         Complications: No apparent anesthesia complications

## 2017-03-05 ENCOUNTER — Encounter: Payer: Self-pay | Admitting: Gastroenterology

## 2017-03-05 DIAGNOSIS — Z7984 Long term (current) use of oral hypoglycemic drugs: Secondary | ICD-10-CM | POA: Diagnosis not present

## 2017-03-05 DIAGNOSIS — Z48815 Encounter for surgical aftercare following surgery on the digestive system: Secondary | ICD-10-CM | POA: Diagnosis not present

## 2017-03-05 DIAGNOSIS — E119 Type 2 diabetes mellitus without complications: Secondary | ICD-10-CM | POA: Diagnosis not present

## 2017-03-05 DIAGNOSIS — I10 Essential (primary) hypertension: Secondary | ICD-10-CM | POA: Diagnosis not present

## 2017-03-06 DIAGNOSIS — E538 Deficiency of other specified B group vitamins: Secondary | ICD-10-CM | POA: Diagnosis not present

## 2017-03-07 DIAGNOSIS — E119 Type 2 diabetes mellitus without complications: Secondary | ICD-10-CM | POA: Diagnosis not present

## 2017-03-07 DIAGNOSIS — I10 Essential (primary) hypertension: Secondary | ICD-10-CM | POA: Diagnosis not present

## 2017-03-07 DIAGNOSIS — Z48815 Encounter for surgical aftercare following surgery on the digestive system: Secondary | ICD-10-CM | POA: Diagnosis not present

## 2017-03-07 DIAGNOSIS — Z7984 Long term (current) use of oral hypoglycemic drugs: Secondary | ICD-10-CM | POA: Diagnosis not present

## 2017-03-10 ENCOUNTER — Telehealth: Payer: Self-pay

## 2017-03-10 DIAGNOSIS — Z7984 Long term (current) use of oral hypoglycemic drugs: Secondary | ICD-10-CM | POA: Diagnosis not present

## 2017-03-10 DIAGNOSIS — I10 Essential (primary) hypertension: Secondary | ICD-10-CM | POA: Diagnosis not present

## 2017-03-10 DIAGNOSIS — E119 Type 2 diabetes mellitus without complications: Secondary | ICD-10-CM | POA: Diagnosis not present

## 2017-03-10 DIAGNOSIS — Z48815 Encounter for surgical aftercare following surgery on the digestive system: Secondary | ICD-10-CM | POA: Diagnosis not present

## 2017-03-10 NOTE — Telephone Encounter (Signed)
Nurse from [facility] called and said that she notice a lot of mucus when flushing the patients drain. She stated that patients wife said that his drain output has been changing since having his Cholangiogram. The wife stated to the nurse that it has been draining black and orange drainage at times. She asked if hew needed to be seen. I told her that we can see him in office tomorrow in [place]. They said that would be fine. I told them I would have our front desk to call and schedule the appointment. They verbalized understanding.

## 2017-03-11 ENCOUNTER — Ambulatory Visit: Payer: Medicare HMO | Admitting: Surgery

## 2017-03-12 ENCOUNTER — Ambulatory Visit (INDEPENDENT_AMBULATORY_CARE_PROVIDER_SITE_OTHER): Payer: Medicare HMO | Admitting: Surgery

## 2017-03-12 ENCOUNTER — Encounter: Payer: Self-pay | Admitting: Surgery

## 2017-03-12 VITALS — BP 135/69 | HR 82 | Temp 98.2°F | Ht 68.0 in | Wt 157.4 lb

## 2017-03-12 DIAGNOSIS — K8020 Calculus of gallbladder without cholecystitis without obstruction: Secondary | ICD-10-CM

## 2017-03-12 NOTE — Progress Notes (Signed)
Outpatient Surgical Follow Up  03/12/2017  Benjamin Austin is an 83 y.o. male.   Chief Complaint  Patient presents with  . Follow-up    Cholecystostomy Drain  . Results    Cholangiogram    HPI: jermey is a 83 year old well known to our practice with a history of cholecystitis and choledocholithiasis he is is status post ERCP and ch He did very well after ERCP. Images personally reviewed showing patency bile duct.he has been doing very well has been asymptomatic. No fevers no chills no abdominal pain. Cholecystostmy tube is widely open. I did review his vious cholangiogram showing functional tube with CBD stones  Past Medical History:  Diagnosis Date  . Arthritis   . Diabetes mellitus without complication ([place])   . Hypercholesteremia   . Hypertension     Past Surgical History:  Procedure Laterality Date  . APPENDECTOMY    . ERCP N/A 03/04/2017   Procedure: ENDOSCOPIC RETROGRADE CHOLANGIOPANCREATOGRAPHY (ERCP);  Surgeon: Lucilla Lame, MD;  Location[facility] ENDOSCOPY;  Service: Endoscopy;  Laterality: N/A;  . HERNIA REPAIR    . IR GUIDED DRAIN W CATHETER PLACEMENT  01/18/2017    Family History  Problem Relation Age of Onset  . Heart disease Father     Social History:  reports that he has quit smoking. His smoking use included Cigars. He has never used smokeless tobacco. He reports that he does not drink alcohol or use drugs.  Allergies: No Known Allergies  Medications reviewed.    ROS Full ROS performed and is otherwise negative other than what is stated in HPI   BP 135/69   Pulse 82   Temp 98.2 F (36.8 C) (Oral)   Ht 5\' 8"  (1.727 m)   Wt 71.4 kg (157 lb 6.4 oz)   BMI 23.93 kg/m    Physical Exam  Constitutional: He is oriented to person, place, and time and well-developed, well-nourished, and in no distress. No distress.  Neck: No JVD present. No tracheal deviation present.  Cardiovascular: Normal rate and intact distal pulses.   Pulmonary/Chest: Effort normal.  No stridor. No respiratory distress. He exhibits no tenderness.  Abdominal: Soft. He exhibits no distension. There is no tenderness. There is no rebound and no guarding.  Chole tube in place w golden bile. No peritonitis  Musculoskeletal: Normal range of motion.  Neurological: He is alert and oriented to person, place, and time. Gait normal. GCS score is 15.  Skin: Skin is warm and dry. He is not diaphoretic.  Psychiatric: Mood, memory, affect and judgment normal.  Nursing note and vitals reviewed.  Assessment/Plan: History of cholecystitis and choledocholithiasis status post ERCP. The patient will need elective cholecystectomy w IOC d/ with him about the surgery. The risks, benefits, complications, treatment options, and expected outcomes were discussed with the patient. The possibilities of bleeding, recurrent infection, finding a normal gallbladder, perforation of viscus organs, damage to surrounding structures, bile leak, abscess formation, needing a drain placed, the need for additional procedures, reaction to medication, pulmonary aspiration,  failure to diagnose a condition, the possible need to convert to an open procedure, and creating a complication requiring transfusion or operation were discussed with the patient. The patient and/or family concurred with the proposed plan, giving informed consent.    Caroleen Hamman, MD [facility] General Surgeon

## 2017-03-12 NOTE — Addendum Note (Signed)
Addended by: Caroleen Hamman F on: 03/12/2017 12:01 PM   Modules accepted: Orders, SmartSet

## 2017-03-12 NOTE — Patient Instructions (Signed)
You have requested to have your gallbladder removed. This will be done on 04/02/17 at [facility] with Dr. Dahlia Byes.  You will be able to eat anything you would like to following surgery. But, start by eating a bland diet and advance this as tolerated. The Gallbladder diet is below, please go as closely by this diet as possible prior to surgery to avoid any further attacks.  We encourage you to move around as much as possible and be as healthy as you can prior to your surgery. You may cut your grass, weed eat, do any lawn care needed, and even go fishing.  Please see the (blue)pre-care form that you have been given today. If you have any questions, please call our office.  Laparoscopic Cholecystectomy Laparoscopic cholecystectomy is surgery to remove the gallbladder. The gallbladder is located in the upper right part of the abdomen, behind the liver. It is a storage sac for bile, which is produced in the liver. Bile aids in the digestion and absorption of fats. Cholecystectomy is often done for inflammation of the gallbladder (cholecystitis). This condition is usually caused by a buildup of gallstones (cholelithiasis) in the gallbladder. Gallstones can block the flow of bile, and that can result in inflammation and pain. In severe cases, emergency surgery may be required. If emergency surgery is not required, you will have time to prepare for the procedure. Laparoscopic surgery is an alternative to open surgery. Laparoscopic surgery has a shorter recovery time. Your common bile duct may also need to be examined during the procedure. If stones are found in the common bile duct, they may be removed. LET [facility] CARE PROVIDER KNOW ABOUT:  Any allergies you have.  All medicines you are taking, including vitamins, herbs, eye drops, creams, and over-the-counter medicines.  Previous problems you or members of your family have had with the use of anesthetics.  Any blood disorders you  have.  Previous surgeries you have had.    Any medical conditions you have. RISKS AND COMPLICATIONS Generally, this is a safe procedure. However, problems may occur, including:  Infection.  Bleeding.  Allergic reactions to medicines.  Damage to other structures or organs.  A stone remaining in the common bile duct.  A bile leak from the cyst duct that is clipped when your gallbladder is removed.  The need to convert to open surgery, which requires a larger incision in the abdomen. This may be necessary if your surgeon thinks that it is not safe to continue with a laparoscopic procedure. BEFORE THE PROCEDURE  Ask your health care provider about:  Changing or stopping your regular medicines. This is especially important if you are taking diabetes medicines or blood thinners.  Taking medicines such as aspirin and ibuprofen. These medicines can thin your blood. Do not take these medicines before your procedure if your health care provider instructs you not to.  Follow instructions from your health care provider about eating or drinking restrictions.  Let your health care provider know if you develop a cold or an infection before surgery.  Plan to have someone take you home after the procedure.  Ask your health care provider how your surgical site will be marked or identified.  You may be given antibiotic medicine to help prevent infection. PROCEDURE  To reduce your risk of infection:  Your health care team will wash or sanitize their hands.  Your skin will be washed with soap.  An IV tube may be inserted into one of your veins.  You will be given a medicine to make you fall asleep (general anesthetic).  A breathing tube will be placed in your mouth.  The surgeon will make several small cuts (incisions) in your abdomen.  A thin, lighted tube (laparoscope) that has a tiny camera on the end will be inserted through one of the small incisions. The camera on the  laparoscope will send a picture to a TV screen (monitor) in the operating room. This will give the surgeon a good view inside your abdomen.  A gas will be pumped into your abdomen. This will expand your abdomen to give the surgeon more room to perform the surgery.  Other tools that are needed for the procedure will be inserted through the other incisions. The gallbladder will be removed through one of the incisions.  After your gallbladder has been removed, the incisions will be closed with stitches (sutures), staples, or skin glue.  Your incisions may be covered with a bandage (dressing). The procedure may vary among health care providers and hospitals. AFTER THE PROCEDURE  Your blood pressure, heart rate, breathing rate, and blood oxygen level will be monitored often until the medicines you were given have worn off.  You will be given medicines as needed to control your pain.   This information is not intended to replace advice given to you by your health care provider. Make sure you discuss any questions you have with your health care provider.   Document Released: 05/27/2005 Document Revised: 02/15/2015 Document Reviewed: 01/06/2013 Elsevier Interactive Patient Education [employer].

## 2017-03-13 ENCOUNTER — Telehealth: Payer: Self-pay | Admitting: Surgery

## 2017-03-13 NOTE — Telephone Encounter (Signed)
Pt advised of pre op date/time and sx date. Sx: 04/02/17 with Dr Pabon--Laparoscopic cholecystectomy with gram.  Pre op: 03/24/17 @ 8:45am--office.   Patient made aware to call [phone], between 1-3:00pm the day before surgery, to find out what time to arrive.

## 2017-03-14 DIAGNOSIS — E119 Type 2 diabetes mellitus without complications: Secondary | ICD-10-CM | POA: Diagnosis not present

## 2017-03-14 DIAGNOSIS — Z48815 Encounter for surgical aftercare following surgery on the digestive system: Secondary | ICD-10-CM | POA: Diagnosis not present

## 2017-03-14 DIAGNOSIS — I10 Essential (primary) hypertension: Secondary | ICD-10-CM | POA: Diagnosis not present

## 2017-03-14 DIAGNOSIS — Z7984 Long term (current) use of oral hypoglycemic drugs: Secondary | ICD-10-CM | POA: Diagnosis not present

## 2017-03-17 DIAGNOSIS — Z7984 Long term (current) use of oral hypoglycemic drugs: Secondary | ICD-10-CM | POA: Diagnosis not present

## 2017-03-17 DIAGNOSIS — E119 Type 2 diabetes mellitus without complications: Secondary | ICD-10-CM | POA: Diagnosis not present

## 2017-03-17 DIAGNOSIS — I10 Essential (primary) hypertension: Secondary | ICD-10-CM | POA: Diagnosis not present

## 2017-03-17 DIAGNOSIS — Z48815 Encounter for surgical aftercare following surgery on the digestive system: Secondary | ICD-10-CM | POA: Diagnosis not present

## 2017-03-19 DIAGNOSIS — Z7984 Long term (current) use of oral hypoglycemic drugs: Secondary | ICD-10-CM | POA: Diagnosis not present

## 2017-03-19 DIAGNOSIS — I10 Essential (primary) hypertension: Secondary | ICD-10-CM | POA: Diagnosis not present

## 2017-03-19 DIAGNOSIS — Z48815 Encounter for surgical aftercare following surgery on the digestive system: Secondary | ICD-10-CM | POA: Diagnosis not present

## 2017-03-19 DIAGNOSIS — E119 Type 2 diabetes mellitus without complications: Secondary | ICD-10-CM | POA: Diagnosis not present

## 2017-03-21 DIAGNOSIS — Z7984 Long term (current) use of oral hypoglycemic drugs: Secondary | ICD-10-CM | POA: Diagnosis not present

## 2017-03-21 DIAGNOSIS — I10 Essential (primary) hypertension: Secondary | ICD-10-CM | POA: Diagnosis not present

## 2017-03-21 DIAGNOSIS — E119 Type 2 diabetes mellitus without complications: Secondary | ICD-10-CM | POA: Diagnosis not present

## 2017-03-21 DIAGNOSIS — Z48815 Encounter for surgical aftercare following surgery on the digestive system: Secondary | ICD-10-CM | POA: Diagnosis not present

## 2017-03-24 ENCOUNTER — Encounter
Admission: RE | Admit: 2017-03-24 | Discharge: 2017-03-24 | Disposition: A | Discharge status: Home / Self Care | Payer: Medicare HMO | Source: Ambulatory Visit | Attending: Surgery | Admitting: Surgery

## 2017-03-24 DIAGNOSIS — Z7984 Long term (current) use of oral hypoglycemic drugs: Secondary | ICD-10-CM | POA: Diagnosis not present

## 2017-03-24 DIAGNOSIS — I10 Essential (primary) hypertension: Secondary | ICD-10-CM | POA: Diagnosis not present

## 2017-03-24 DIAGNOSIS — Z01818 Encounter for other preprocedural examination: Secondary | ICD-10-CM | POA: Insufficient documentation

## 2017-03-24 DIAGNOSIS — E119 Type 2 diabetes mellitus without complications: Secondary | ICD-10-CM | POA: Diagnosis not present

## 2017-03-24 DIAGNOSIS — Z48815 Encounter for surgical aftercare following surgery on the digestive system: Secondary | ICD-10-CM | POA: Diagnosis not present

## 2017-03-24 HISTORY — DX: Calculus of bile duct without cholangitis or cholecystitis without obstruction: K80.50

## 2017-03-24 HISTORY — DX: Pneumonia, unspecified organism: J18.9

## 2017-03-24 HISTORY — DX: Tremor, unspecified: R25.1

## 2017-03-24 NOTE — Patient Instructions (Signed)
Your procedure is scheduled on: 04/02/2017  Report to Same Day Surgery To find out your arrival time please call [phone] between 1PM - 3PM on Tuesday 04/01/17.  Remember: Instructions that are not followed completely may result in serious medical risk, up to and including death, or upon the discretion of your surgeon and anesthesiologist your surgery may need to be rescheduled.     _X__ 1. Do not eat food after midnight the night before your procedure.                 No gum chewing or hard candies. You may drink clear liquids up to 2 hours                 before you are scheduled to arrive for your surgery- DO not drink clear                 liquids within 2 hours of the start of your surgery.                 Clear Liquids include:  Water.        ___ 2.  No Alcohol for 24 hours before or after surgery.   ___ 3.  Do Not Smoke or use e-cigarettes For 24 Hours Prior to Your Surgery.                 Do not use any chewable tobacco products for at least 6 hours prior to                 surgery.  ____  4.  Bring all medications with you on the day of surgery if instructed.   ____  5.  Notify your doctor if there is any change in your medical condition      (cold, fever, infections).     Do not wear jewelry, make-up, hairpins, clips or nail polish. Do not wear lotions, powders, or perfumes. You may wear deodorant. Do not shave 48 hours prior to surgery. Men may shave face and neck. Do not bring valuables to the hospital.    [facility] is not responsible for any belongings or valuables.  Contacts, dentures or bridgework may not be worn into surgery. Leave your suitcase in the car. After surgery it may be brought to your room. For patients admitted to the hospital, discharge time is determined by your treatment team.   Patients discharged the day of surgery will not be allowed to drive home.   Please read over the following fact sheets that you were given:    Pre-paring for Suergery          ____ Take these medicines the morning of surgery with A SIP OF WATER: NONE                ____ Fleet Enema (as directed)   ____ Use CHG Soap as directed  ____ Use inhalers on the day of surgery  __X__ Stop metformin 2 days prior to surgery    ____ Take 1/2 of usual insulin dose the night before surgery. No insulin the morning          of surgery.   ____ Stop Coumadin/Plavix/aspirin on doesn't not apply  __X__ Stop Anti-inflammatories on Aleve, may take tylenol for pain    __X__ Stop supplements Fish Oil today until after surgery.    ____ Bring C-Pap to the hospital.

## 2017-03-26 DIAGNOSIS — Z48815 Encounter for surgical aftercare following surgery on the digestive system: Secondary | ICD-10-CM | POA: Diagnosis not present

## 2017-03-26 DIAGNOSIS — I10 Essential (primary) hypertension: Secondary | ICD-10-CM | POA: Diagnosis not present

## 2017-03-26 DIAGNOSIS — Z7984 Long term (current) use of oral hypoglycemic drugs: Secondary | ICD-10-CM | POA: Diagnosis not present

## 2017-03-26 DIAGNOSIS — E119 Type 2 diabetes mellitus without complications: Secondary | ICD-10-CM | POA: Diagnosis not present

## 2017-03-28 DIAGNOSIS — Z48815 Encounter for surgical aftercare following surgery on the digestive system: Secondary | ICD-10-CM | POA: Diagnosis not present

## 2017-03-28 DIAGNOSIS — I10 Essential (primary) hypertension: Secondary | ICD-10-CM | POA: Diagnosis not present

## 2017-03-28 DIAGNOSIS — Z7984 Long term (current) use of oral hypoglycemic drugs: Secondary | ICD-10-CM | POA: Diagnosis not present

## 2017-03-28 DIAGNOSIS — E119 Type 2 diabetes mellitus without complications: Secondary | ICD-10-CM | POA: Diagnosis not present

## 2017-03-31 DIAGNOSIS — E119 Type 2 diabetes mellitus without complications: Secondary | ICD-10-CM | POA: Diagnosis not present

## 2017-03-31 DIAGNOSIS — Z7984 Long term (current) use of oral hypoglycemic drugs: Secondary | ICD-10-CM | POA: Diagnosis not present

## 2017-03-31 DIAGNOSIS — Z48815 Encounter for surgical aftercare following surgery on the digestive system: Secondary | ICD-10-CM | POA: Diagnosis not present

## 2017-03-31 DIAGNOSIS — I10 Essential (primary) hypertension: Secondary | ICD-10-CM | POA: Diagnosis not present

## 2017-04-01 MED ORDER — CEFAZOLIN SODIUM-DEXTROSE 2-4 GM/100ML-% IV SOLN
2.0000 g | INTRAVENOUS | Status: AC
  Administered 2017-04-02: 2 g via INTRAVENOUS

## 2017-04-02 ENCOUNTER — Ambulatory Visit: Payer: Medicare HMO

## 2017-04-02 ENCOUNTER — Ambulatory Visit: Payer: Medicare HMO | Admitting: Registered Nurse

## 2017-04-02 ENCOUNTER — Encounter
Admission: AD | Disposition: A | Discharge status: Home / Self Care | Payer: Self-pay | Source: Ambulatory Visit | Attending: Surgery

## 2017-04-02 ENCOUNTER — Inpatient Hospital Stay
Admission: AD | Admit: 2017-04-02 | Discharge: 2017-04-09 | DRG: 415 | Disposition: A | Discharge status: Home / Self Care | Payer: Medicare HMO | Source: Ambulatory Visit | Attending: Surgery | Admitting: Surgery

## 2017-04-02 DIAGNOSIS — K8064 Calculus of gallbladder and bile duct with chronic cholecystitis without obstruction: Principal | ICD-10-CM | POA: Diagnosis present

## 2017-04-02 DIAGNOSIS — K811 Chronic cholecystitis: Secondary | ICD-10-CM

## 2017-04-02 DIAGNOSIS — R109 Unspecified abdominal pain: Secondary | ICD-10-CM | POA: Diagnosis not present

## 2017-04-02 DIAGNOSIS — Z8701 Personal history of pneumonia (recurrent): Secondary | ICD-10-CM

## 2017-04-02 DIAGNOSIS — K8030 Calculus of bile duct with cholangitis, unspecified, without obstruction: Secondary | ICD-10-CM | POA: Diagnosis not present

## 2017-04-02 DIAGNOSIS — I10 Essential (primary) hypertension: Secondary | ICD-10-CM | POA: Diagnosis present

## 2017-04-02 DIAGNOSIS — K839 Disease of biliary tract, unspecified: Secondary | ICD-10-CM | POA: Diagnosis not present

## 2017-04-02 DIAGNOSIS — M1990 Unspecified osteoarthritis, unspecified site: Secondary | ICD-10-CM | POA: Diagnosis not present

## 2017-04-02 DIAGNOSIS — Z5331 Laparoscopic surgical procedure converted to open procedure: Secondary | ICD-10-CM

## 2017-04-02 DIAGNOSIS — Z9189 Other specified personal risk factors, not elsewhere classified: Secondary | ICD-10-CM

## 2017-04-02 DIAGNOSIS — D62 Acute posthemorrhagic anemia: Secondary | ICD-10-CM | POA: Diagnosis not present

## 2017-04-02 DIAGNOSIS — Z87891 Personal history of nicotine dependence: Secondary | ICD-10-CM

## 2017-04-02 DIAGNOSIS — K832 Perforation of bile duct: Secondary | ICD-10-CM | POA: Diagnosis not present

## 2017-04-02 DIAGNOSIS — K219 Gastro-esophageal reflux disease without esophagitis: Secondary | ICD-10-CM | POA: Diagnosis not present

## 2017-04-02 DIAGNOSIS — K8020 Calculus of gallbladder without cholecystitis without obstruction: Secondary | ICD-10-CM | POA: Diagnosis not present

## 2017-04-02 DIAGNOSIS — E119 Type 2 diabetes mellitus without complications: Secondary | ICD-10-CM | POA: Diagnosis not present

## 2017-04-02 DIAGNOSIS — Z419 Encounter for procedure for purposes other than remedying health state, unspecified: Secondary | ICD-10-CM

## 2017-04-02 DIAGNOSIS — Z9049 Acquired absence of other specified parts of digestive tract: Secondary | ICD-10-CM

## 2017-04-02 DIAGNOSIS — K812 Acute cholecystitis with chronic cholecystitis: Secondary | ICD-10-CM | POA: Diagnosis not present

## 2017-04-02 DIAGNOSIS — Y838 Other surgical procedures as the cause of abnormal reaction of the patient, or of later complication, without mention of misadventure at the time of the procedure: Secondary | ICD-10-CM | POA: Diagnosis not present

## 2017-04-02 DIAGNOSIS — I9752 Accidental puncture and laceration of a circulatory system organ or structure during other procedure: Secondary | ICD-10-CM | POA: Diagnosis not present

## 2017-04-02 DIAGNOSIS — K838 Other specified diseases of biliary tract: Secondary | ICD-10-CM

## 2017-04-02 DIAGNOSIS — K660 Peritoneal adhesions (postprocedural) (postinfection): Secondary | ICD-10-CM | POA: Diagnosis present

## 2017-04-02 DIAGNOSIS — E7800 Pure hypercholesterolemia, unspecified: Secondary | ICD-10-CM | POA: Diagnosis not present

## 2017-04-02 DIAGNOSIS — T182XXA Foreign body in stomach, initial encounter: Secondary | ICD-10-CM | POA: Diagnosis not present

## 2017-04-02 DIAGNOSIS — Z8249 Family history of ischemic heart disease and other diseases of the circulatory system: Secondary | ICD-10-CM | POA: Diagnosis not present

## 2017-04-02 HISTORY — PX: CHOLECYSTECTOMY: SHX55

## 2017-04-02 LAB — CBC
HEMATOCRIT: 36.3 % — AB (ref 40.0–52.0)
Hemoglobin: 11.8 g/dL — ABNORMAL LOW (ref 13.0–18.0)
MCH: 27.8 pg (ref 26.0–34.0)
MCHC: 32.4 g/dL (ref 32.0–36.0)
MCV: 85.7 fL (ref 80.0–100.0)
PLATELETS: 173 10*3/uL (ref 150–440)
RBC: 4.23 MIL/uL — AB (ref 4.40–5.90)
RDW: 15.3 % — ABNORMAL HIGH (ref 11.5–14.5)
WBC: 16.6 10*3/uL — ABNORMAL HIGH (ref 3.8–10.6)

## 2017-04-02 LAB — GLUCOSE, CAPILLARY
GLUCOSE-CAPILLARY: 139 mg/dL — AB (ref 65–99)
GLUCOSE-CAPILLARY: 249 mg/dL — AB (ref 65–99)
GLUCOSE-CAPILLARY: 231 mg/dL — AB (ref 65–99)
Glucose-Capillary: 266 mg/dL — ABNORMAL HIGH (ref 65–99)

## 2017-04-02 LAB — BASIC METABOLIC PANEL
Anion gap: 10 (ref 5–15)
BUN: 21 mg/dL — AB (ref 6–20)
CHLORIDE: 110 mmol/L (ref 101–111)
CO2: 21 mmol/L — AB (ref 22–32)
Calcium: 7.9 mg/dL — ABNORMAL LOW (ref 8.9–10.3)
Creatinine, Ser: 1.12 mg/dL (ref 0.61–1.24)
GFR calc Af Amer: >60 mL/min (ref >60)
GFR, EST NON AFRICAN AMERICAN: 58 mL/min — AB (ref >60)
GLUCOSE: 293 mg/dL — AB (ref 65–99)
POTASSIUM: 3.7 mmol/L (ref 3.5–5.1)
Sodium: 141 mmol/L (ref 135–145)

## 2017-04-02 LAB — MRSA PCR SCREENING: MRSA BY PCR: NEGATIVE

## 2017-04-02 LAB — APTT: APTT: 28 s (ref 24–36)

## 2017-04-02 LAB — PROTIME-INR
INR: 1.23
Prothrombin Time: 15.4 seconds — ABNORMAL HIGH (ref 11.4–15.2)

## 2017-04-02 LAB — PREPARE RBC (CROSSMATCH)

## 2017-04-02 LAB — ABO/RH: ABO/RH(D): B POS

## 2017-04-02 SURGERY — LAPAROSCOPIC CHOLECYSTECTOMY WITH INTRAOPERATIVE CHOLANGIOGRAM
Anesthesia: General | Site: Abdomen | Wound class: Clean Contaminated

## 2017-04-02 MED ORDER — HYDRALAZINE HCL 20 MG/ML IJ SOLN
10.0000 mg | INTRAMUSCULAR | Status: DC | PRN
  Administered 2017-04-02: 10 mg via INTRAVENOUS
  Filled 2017-04-02: qty 1

## 2017-04-02 MED ORDER — INSULIN ASPART 100 UNIT/ML ~~LOC~~ SOLN
0.0000 [IU] | Freq: Every day | SUBCUTANEOUS | Status: DC
  Administered 2017-04-02: 3 [IU] via SUBCUTANEOUS
  Filled 2017-04-02 (×2): qty 1

## 2017-04-02 MED ORDER — OXYCODONE HCL 5 MG PO TABS
5.0000 mg | ORAL_TABLET | ORAL | Status: DC | PRN
  Administered 2017-04-02 – 2017-04-03 (×3): 10 mg via ORAL
  Filled 2017-04-02 (×3): qty 2

## 2017-04-02 MED ORDER — CHLORHEXIDINE GLUCONATE CLOTH 2 % EX PADS: 6.0000 | MEDICATED_PAD | Freq: Once | CUTANEOUS | Status: DC

## 2017-04-02 MED ORDER — LIDOCAINE HCL (CARDIAC) 20 MG/ML IV SOLN
INTRAVENOUS | Status: DC | PRN
  Administered 2017-04-02: 80 mg via INTRAVENOUS

## 2017-04-02 MED ORDER — ONDANSETRON HCL 4 MG/2ML IJ SOLN: 4.0000 mg | Freq: Once | INTRAMUSCULAR | Status: DC | PRN

## 2017-04-02 MED ORDER — ROCURONIUM BROMIDE 50 MG/5ML IV SOLN
INTRAVENOUS | Status: AC
  Filled 2017-04-02: qty 1

## 2017-04-02 MED ORDER — ALBUMIN HUMAN 5 % IV SOLN
INTRAVENOUS | Status: DC | PRN
  Administered 2017-04-02: 14:00:00 via INTRAVENOUS

## 2017-04-02 MED ORDER — SODIUM CHLORIDE 0.9 % IJ SOLN
INTRAMUSCULAR | Status: AC
  Filled 2017-04-02: qty 50

## 2017-04-02 MED ORDER — INSULIN ASPART 100 UNIT/ML ~~LOC~~ SOLN
0.0000 [IU] | Freq: Three times a day (TID) | SUBCUTANEOUS | Status: DC
  Administered 2017-04-02 – 2017-04-03 (×2): 5 [IU] via SUBCUTANEOUS
  Administered 2017-04-04 – 2017-04-05 (×4): 3 [IU] via SUBCUTANEOUS
  Administered 2017-04-05 – 2017-04-06 (×4): 2 [IU] via SUBCUTANEOUS
  Administered 2017-04-06 – 2017-04-07 (×2): 5 [IU] via SUBCUTANEOUS
  Administered 2017-04-07: 2 [IU] via SUBCUTANEOUS
  Administered 2017-04-08: 3 [IU] via SUBCUTANEOUS
  Administered 2017-04-08: 2 [IU] via SUBCUTANEOUS
  Administered 2017-04-08: 3 [IU] via SUBCUTANEOUS
  Administered 2017-04-09: 5 [IU] via SUBCUTANEOUS
  Filled 2017-04-02 (×18): qty 1

## 2017-04-02 MED ORDER — GLYCOPYRROLATE 0.2 MG/ML IJ SOLN
INTRAMUSCULAR | Status: DC | PRN
  Administered 2017-04-02: 0.2 mg via INTRAVENOUS

## 2017-04-02 MED ORDER — SUGAMMADEX SODIUM 200 MG/2ML IV SOLN
INTRAVENOUS | Status: AC
  Filled 2017-04-02: qty 2

## 2017-04-02 MED ORDER — DEXAMETHASONE SODIUM PHOSPHATE 10 MG/ML IJ SOLN
INTRAMUSCULAR | Status: AC
  Filled 2017-04-02: qty 1

## 2017-04-02 MED ORDER — LACTATED RINGERS IV SOLN
INTRAVENOUS | Status: DC
  Administered 2017-04-02 (×2): via INTRAVENOUS
  Administered 2017-04-03: 100 mL/h via INTRAVENOUS

## 2017-04-02 MED ORDER — LACTATED RINGERS IV SOLN
INTRAVENOUS | Status: DC | PRN
  Administered 2017-04-02: 14:00:00 via INTRAVENOUS

## 2017-04-02 MED ORDER — SUCCINYLCHOLINE CHLORIDE 20 MG/ML IJ SOLN
INTRAMUSCULAR | Status: AC
  Filled 2017-04-02: qty 1

## 2017-04-02 MED ORDER — ACETAMINOPHEN 500 MG PO TABS
1000.0000 mg | ORAL_TABLET | Freq: Four times a day (QID) | ORAL | Status: DC
  Administered 2017-04-02 – 2017-04-09 (×22): 1000 mg via ORAL
  Filled 2017-04-02 (×26): qty 2

## 2017-04-02 MED ORDER — LIDOCAINE HCL (PF) 2 % IJ SOLN
INTRAMUSCULAR | Status: AC
  Filled 2017-04-02: qty 10

## 2017-04-02 MED ORDER — LACTATED RINGERS IV SOLN
INTRAVENOUS | Status: DC | PRN
  Administered 2017-04-02: 15:00:00 via INTRAVENOUS

## 2017-04-02 MED ORDER — MIDAZOLAM HCL 2 MG/2ML IJ SOLN
INTRAMUSCULAR | Status: DC | PRN
  Administered 2017-04-02: 1 mg via INTRAVENOUS

## 2017-04-02 MED ORDER — EPHEDRINE SULFATE 50 MG/ML IJ SOLN
INTRAMUSCULAR | Status: DC | PRN
  Administered 2017-04-02: 5 mg via INTRAVENOUS
  Administered 2017-04-02 (×2): 10 mg via INTRAVENOUS
  Administered 2017-04-02: 5 mg via INTRAVENOUS

## 2017-04-02 MED ORDER — BUPIVACAINE LIPOSOME 1.3 % IJ SUSP
INTRAMUSCULAR | Status: AC
  Filled 2017-04-02: qty 20

## 2017-04-02 MED ORDER — ALBUMIN HUMAN 5 % IV SOLN
INTRAVENOUS | Status: AC
  Filled 2017-04-02: qty 250

## 2017-04-02 MED ORDER — SODIUM CHLORIDE 0.9 % IV SOLN: Freq: Once | INTRAVENOUS | Status: DC

## 2017-04-02 MED ORDER — FAMOTIDINE IN NACL 20-0.9 MG/50ML-% IV SOLN
20.0000 mg | Freq: Once | INTRAVENOUS | Status: DC
  Filled 2017-04-02: qty 50

## 2017-04-02 MED ORDER — BUPIVACAINE LIPOSOME 1.3 % IJ SUSP
INTRAMUSCULAR | Status: DC | PRN
Start: 2017-04-02 — End: 2017-04-02
  Administered 2017-04-02: 20 mL

## 2017-04-02 MED ORDER — ONDANSETRON HCL 4 MG/2ML IJ SOLN
INTRAMUSCULAR | Status: AC
  Filled 2017-04-02: qty 2

## 2017-04-02 MED ORDER — EPHEDRINE SULFATE 50 MG/ML IJ SOLN
INTRAMUSCULAR | Status: AC
  Filled 2017-04-02: qty 1

## 2017-04-02 MED ORDER — SODIUM CHLORIDE 0.9 % IV SOLN
INTRAVENOUS | Status: DC | PRN
  Administered 2017-04-02: 50 ug/min via INTRAVENOUS

## 2017-04-02 MED ORDER — CEFAZOLIN SODIUM-DEXTROSE 2-4 GM/100ML-% IV SOLN
INTRAVENOUS | Status: AC
  Filled 2017-04-02: qty 100

## 2017-04-02 MED ORDER — DEXAMETHASONE SODIUM PHOSPHATE 10 MG/ML IJ SOLN
INTRAMUSCULAR | Status: DC | PRN
  Administered 2017-04-02: 5 mg via INTRAVENOUS

## 2017-04-02 MED ORDER — FENTANYL CITRATE (PF) 100 MCG/2ML IJ SOLN
INTRAMUSCULAR | Status: DC | PRN
Start: 2017-04-02 — End: 2017-04-02
  Administered 2017-04-02 (×2): 50 ug via INTRAVENOUS

## 2017-04-02 MED ORDER — ONDANSETRON HCL 4 MG/2ML IJ SOLN
INTRAMUSCULAR | Status: DC | PRN
  Administered 2017-04-02: 4 mg via INTRAVENOUS

## 2017-04-02 MED ORDER — PANTOPRAZOLE SODIUM 40 MG IV SOLR
40.0000 mg | Freq: Every day | INTRAVENOUS | Status: DC
  Administered 2017-04-02 – 2017-04-03 (×2): 40 mg via INTRAVENOUS
  Filled 2017-04-02 (×2): qty 40

## 2017-04-02 MED ORDER — ONDANSETRON 4 MG PO TBDP
4.0000 mg | ORAL_TABLET | Freq: Four times a day (QID) | ORAL | Status: DC | PRN
  Filled 2017-04-02: qty 1

## 2017-04-02 MED ORDER — FENTANYL CITRATE (PF) 100 MCG/2ML IJ SOLN
25.0000 ug | INTRAMUSCULAR | Status: DC | PRN
  Administered 2017-04-02 (×4): 25 ug via INTRAVENOUS

## 2017-04-02 MED ORDER — FAMOTIDINE 20 MG PO TABS
ORAL_TABLET | ORAL | Status: AC
  Administered 2017-04-02: 20 mg
  Filled 2017-04-02: qty 1

## 2017-04-02 MED ORDER — INSULIN ASPART 100 UNIT/ML ~~LOC~~ SOLN
3.0000 [IU] | Freq: Three times a day (TID) | SUBCUTANEOUS | Status: DC
  Administered 2017-04-03 – 2017-04-09 (×14): 3 [IU] via SUBCUTANEOUS
  Filled 2017-04-02 (×14): qty 1

## 2017-04-02 MED ORDER — PROPOFOL 10 MG/ML IV BOLUS
INTRAVENOUS | Status: DC | PRN
  Administered 2017-04-02: 100 mg via INTRAVENOUS

## 2017-04-02 MED ORDER — MIDAZOLAM HCL 2 MG/2ML IJ SOLN
INTRAMUSCULAR | Status: AC
  Filled 2017-04-02: qty 2

## 2017-04-02 MED ORDER — INSULIN ASPART 100 UNIT/ML ~~LOC~~ SOLN: 0.0000 [IU] | Freq: Three times a day (TID) | SUBCUTANEOUS | Status: DC

## 2017-04-02 MED ORDER — BUPIVACAINE-EPINEPHRINE (PF) 0.25% -1:200000 IJ SOLN
INTRAMUSCULAR | Status: AC
Start: 2017-04-02 — End: 2017-04-02
  Filled 2017-04-02: qty 30

## 2017-04-02 MED ORDER — PHENYLEPHRINE HCL 10 MG/ML IJ SOLN
INTRAMUSCULAR | Status: DC | PRN
  Administered 2017-04-02 (×2): 100 ug via INTRAVENOUS
  Administered 2017-04-02: 200 ug via INTRAVENOUS
  Administered 2017-04-02: 100 ug via INTRAVENOUS
  Administered 2017-04-02: 200 ug via INTRAVENOUS
  Administered 2017-04-02: 100 ug via INTRAVENOUS

## 2017-04-02 MED ORDER — PROPOFOL 10 MG/ML IV BOLUS
INTRAVENOUS | Status: AC
  Filled 2017-04-02: qty 20

## 2017-04-02 MED ORDER — FAMOTIDINE 20 MG PO TABS: 20.0000 mg | ORAL_TABLET | Freq: Once | ORAL | Status: DC

## 2017-04-02 MED ORDER — SODIUM CHLORIDE 0.9 % IJ SOLN
INTRAMUSCULAR | Status: DC | PRN
  Administered 2017-04-02: 50 mL via INTRAVENOUS

## 2017-04-02 MED ORDER — INSULIN ASPART 100 UNIT/ML ~~LOC~~ SOLN: 3.0000 [IU] | Freq: Three times a day (TID) | SUBCUTANEOUS | Status: DC

## 2017-04-02 MED ORDER — SODIUM CHLORIDE 0.9 % IV SOLN
INTRAVENOUS | Status: DC
  Administered 2017-04-02 (×2): via INTRAVENOUS

## 2017-04-02 MED ORDER — MORPHINE SULFATE (PF) 2 MG/ML IV SOLN
2.0000 mg | INTRAVENOUS | Status: DC | PRN
  Administered 2017-04-02 (×2): 2 mg via INTRAVENOUS
  Filled 2017-04-02 (×2): qty 1

## 2017-04-02 MED ORDER — ROCURONIUM BROMIDE 50 MG/5ML IV SOLN
INTRAVENOUS | Status: AC
Start: 2017-04-02 — End: 2017-04-02
  Filled 2017-04-02: qty 1

## 2017-04-02 MED ORDER — ROCURONIUM BROMIDE 100 MG/10ML IV SOLN
INTRAVENOUS | Status: DC | PRN
  Administered 2017-04-02 (×3): 10 mg via INTRAVENOUS
  Administered 2017-04-02: 40 mg via INTRAVENOUS

## 2017-04-02 MED ORDER — BUPIVACAINE-EPINEPHRINE 0.25% -1:200000 IJ SOLN
INTRAMUSCULAR | Status: DC | PRN
  Administered 2017-04-02: 30 mL

## 2017-04-02 MED ORDER — FENTANYL CITRATE (PF) 100 MCG/2ML IJ SOLN
INTRAMUSCULAR | Status: AC
  Administered 2017-04-02: 25 ug via INTRAVENOUS
  Filled 2017-04-02: qty 2

## 2017-04-02 MED ORDER — FENTANYL CITRATE (PF) 100 MCG/2ML IJ SOLN
INTRAMUSCULAR | Status: AC
  Filled 2017-04-02: qty 2

## 2017-04-02 MED ORDER — ONDANSETRON HCL 4 MG/2ML IJ SOLN
4.0000 mg | Freq: Four times a day (QID) | INTRAMUSCULAR | Status: DC | PRN
  Administered 2017-04-03: 4 mg via INTRAVENOUS
  Filled 2017-04-02: qty 2

## 2017-04-02 SURGICAL SUPPLY — 68 items
APPLICATOR COTTON TIP 6IN STRL (MISCELLANEOUS) ×2 IMPLANT
APPLIER CLIP 5 13 M/L LIGAMAX5 (MISCELLANEOUS) ×2
APPLIER CLIP ROT 10 11.4 M/L (STAPLE) ×2
BLADE CLIPPER SURG (BLADE) ×2 IMPLANT
BLADE SURG 15 STRL LF DISP TIS (BLADE) ×1 IMPLANT
BLADE SURG 15 STRL SS (BLADE) ×1
BULB RESERV EVAC DRAIN JP 100C (MISCELLANEOUS) ×2 IMPLANT
CANISTER SUCT 1200ML W/VALVE (MISCELLANEOUS) ×2 IMPLANT
CHLORAPREP W/TINT 26ML (MISCELLANEOUS) ×2 IMPLANT
CHOLANGIOGRAM CATH TAUT (CATHETERS) IMPLANT
CLEANER CAUTERY TIP 5X5 PAD (MISCELLANEOUS) ×1 IMPLANT
CLIP APPLIE 5 13 M/L LIGAMAX5 (MISCELLANEOUS) ×1 IMPLANT
CLIP APPLIE ROT 10 11.4 M/L (STAPLE) ×1 IMPLANT
DECANTER SPIKE VIAL GLASS SM (MISCELLANEOUS) IMPLANT
DERMABOND ADVANCED (GAUZE/BANDAGES/DRESSINGS) ×1
DERMABOND ADVANCED .7 DNX12 (GAUZE/BANDAGES/DRESSINGS) ×1 IMPLANT
DRAIN CHANNEL JP 19F (MISCELLANEOUS) ×2 IMPLANT
DRAPE C-ARM XRAY 36X54 (DRAPES) ×2 IMPLANT
DRAPE INCISE IOBAN 66X45 STRL (DRAPES) ×2 IMPLANT
DRAPE SHEET LG 3/4 BI-LAMINATE (DRAPES) ×2 IMPLANT
DRSG OPSITE POSTOP 3X4 (GAUZE/BANDAGES/DRESSINGS) ×2 IMPLANT
DRSG OPSITE POSTOP 4X12 (GAUZE/BANDAGES/DRESSINGS) ×2 IMPLANT
DRSG TEGADERM 2-3/8X2-3/4 SM (GAUZE/BANDAGES/DRESSINGS) ×2 IMPLANT
ELECT BLADE 6.5 EXT (BLADE) ×2 IMPLANT
ELECT CAUTERY BLADE 6.4 (BLADE) ×2 IMPLANT
ELECT REM PT RETURN 9FT ADLT (ELECTROSURGICAL) ×2
ELECTRODE REM PT RTRN 9FT ADLT (ELECTROSURGICAL) ×1 IMPLANT
GELPORT LAPAROSCOPIC (MISCELLANEOUS) ×2 IMPLANT
GLOVE BIO SURGEON STRL SZ7 (GLOVE) ×4 IMPLANT
GOWN STRL REUS W/ TWL LRG LVL3 (GOWN DISPOSABLE) ×3 IMPLANT
GOWN STRL REUS W/TWL LRG LVL3 (GOWN DISPOSABLE) ×3
HEMOSTAT SURGICEL 2X14 (HEMOSTASIS) ×4 IMPLANT
IRRIGATION STRYKERFLOW (MISCELLANEOUS) ×1 IMPLANT
IRRIGATOR STRYKERFLOW (MISCELLANEOUS) ×2
IV CATH ANGIO 12GX3 LT BLUE (NEEDLE) ×2 IMPLANT
IV NS 1000ML (IV SOLUTION) ×1
IV NS 1000ML BAXH (IV SOLUTION) ×1 IMPLANT
L-HOOK LAP DISP 36CM (ELECTROSURGICAL) ×2
LHOOK LAP DISP 36CM (ELECTROSURGICAL) ×1 IMPLANT
NEEDLE HYPO 22GX1.5 SAFETY (NEEDLE) ×2 IMPLANT
PACK LAP CHOLECYSTECTOMY (MISCELLANEOUS) ×2 IMPLANT
PAD CLEANER CAUTERY TIP 5X5 (MISCELLANEOUS) ×1
PENCIL ELECTRO HAND CTR (MISCELLANEOUS) ×2 IMPLANT
POUCH SPECIMEN RETRIEVAL 10MM (ENDOMECHANICALS) ×2 IMPLANT
RELOAD STAPLER BLUE 60MM (STAPLE) ×1 IMPLANT
SCISSORS METZENBAUM CVD 33 (INSTRUMENTS) ×2 IMPLANT
SLEEVE ENDOPATH XCEL 5M (ENDOMECHANICALS) ×6 IMPLANT
SOL ANTI-FOG 6CC FOG-OUT (MISCELLANEOUS) ×1 IMPLANT
SOL FOG-OUT ANTI-FOG 6CC (MISCELLANEOUS) ×1
SPONGE KITTNER 5P (MISCELLANEOUS) ×2 IMPLANT
SPONGE LAP 18X18 5 PK (GAUZE/BANDAGES/DRESSINGS) ×6 IMPLANT
STAPLE ECHEON FLEX 60 POW ENDO (STAPLE) ×2 IMPLANT
STAPLER RELOAD BLUE 60MM (STAPLE) ×2
STAPLER SKIN PROX 35W (STAPLE) ×2 IMPLANT
STOPCOCK 4 WAY LG BORE MALE ST (IV SETS) IMPLANT
SUT ETHIBOND 0 MO6 C/R (SUTURE) IMPLANT
SUT MNCRL AB 4-0 PS2 18 (SUTURE) ×4 IMPLANT
SUT PDS AB 0 CT1 27 (SUTURE) ×6 IMPLANT
SUT PROLENE 4 0 SH DA (SUTURE) ×8 IMPLANT
SUT PROLENE 5 0 RB 1 DA (SUTURE) ×6 IMPLANT
SUT VICRYL 0 AB UR-6 (SUTURE) ×4 IMPLANT
SYR 20CC LL (SYRINGE) ×2 IMPLANT
SYR 30ML LL (SYRINGE) ×2 IMPLANT
TROCAR XCEL BLUNT TIP 100MML (ENDOMECHANICALS) ×2 IMPLANT
TROCAR XCEL NON-BLD 5MMX100MML (ENDOMECHANICALS) ×2 IMPLANT
TUBING INSUFFLATOR HI FLOW (MISCELLANEOUS) ×2 IMPLANT
WATER STERILE IRR 1000ML POUR (IV SOLUTION) ×2 IMPLANT
YANKAUER SUCT BULB TIP FLEX NO (MISCELLANEOUS) ×2 IMPLANT

## 2017-04-02 NOTE — Op Note (Signed)
Pre-operative Diagnosis: Chronic Cholecystitis  Post-operative Diagnosis: Chronic Cholecystitis  Procedure:  Attempted Lap chole Open Cholecystectomy W cholangiogram  Surgeon: Caroleen Hamman, MD FACS  Anesthesia: Gen. with endotracheal tube  Assistant:Dr. Oaks   Findings: Chronic Cholecystitis  Traction injury to the portal vein  Estimated Blood Loss: 2000cc         Drains: 19 FR blake drain         Specimens: Gallbladder           Complications: Traction injury to porta dur to severe inflammatory disease and adhesions.   Procedure Details  The patient was seen again in the Holding Room. The benefits, complications, treatment options, and expected outcomes were discussed with the patient. The risks of bleeding, infection, recurrence of symptoms, failure to resolve symptoms, bile duct damage, bile duct leak, retained common bile duct stone, bowel injury, any of which could require further surgery and/or ERCP, stent, or papillotomy were reviewed with the patient. The likelihood of improving the patient's symptoms with return to their baseline status is good.  The patient and/or family concurred with the proposed plan, giving informed consent.  The patient was taken to Operating Room, identified as Benjamin Austin and the procedure verified as Laparoscopic Cholecystectomy.  A Time Out was held and the above information confirmed.  Prior to the induction of general anesthesia, antibiotic prophylaxis was administered. VTE prophylaxis was in place. General endotracheal anesthesia was then administered and tolerated well. After the induction, the abdomen was prepped with Chloraprep and draped in the sterile fashion. The patient was positioned in the supine position.  Using half strength contrast I was able to perform a cholangiogram the existing) cholecystostomy tube there was adequate patency of the CBD and the contrast reached the the duodenum and hepatic radicals. No filling  defects.  Cut down technique was used to enter the abdominal cavity and a Hasson trochar was placed after two vicryl stitches were anchored to the fascia. Pneumoperitoneum was then created with CO2 and tolerated well without any adverse changes in the patient's vital signs.  Three 5-mm ports were placed in the right upper quadrant all under direct vision. All skin incisions  were infiltrated with a local anesthetic agent before making the incision and placing the trocars.   The patient was positioned  in reverse Trendelenburg, tilted slightly to the patient's left.  We found that the omentum was plastered to the fundus of the gallbladder. There was significant adhesions from the abdominal wall to the liver these adhesions were lysed with electrocautery and a standard fashion. We also were able to remove the omentum from the gallbladder using electrocautery. We identified the gallbladder 97 had significant inflammatory response especially with a the infundibulum. Given the significant inflammatory response I quickly decided to perform a hand-assisted case. I increased my incision and placed the GelPort. Still lysed adhesions  For about 10-15 minutes. Upon retracting the gallbladder there was gush of bleeding that was very significant and dark. I quickly converted the case to a midline exploratory  laparotomy and extended my incision in standard fashion. I placed pressure to stop the bleeding. We quickly gather multiple  Surgical trays and I asked one of my senior partners Dr. Genevive Bi to scrub in.  A Bookwalter retractor was placed and after having adequate exposure we inspected the site, in fact there was a traction injury to the portal vein. This was difficult because he was it close proximity to the common bile and posterior to it. We  did have some problems with exposure but were able to safely exposed our field. . We repaired the portal vein injury with multiple interrupted 5-0 Prolene  on an RB needle.  Please note that during this process we did lose significant blood due to the nature of the injury and the difficult exposure. We rechecked the repair and there was adequate hemostasis. A piece of Surgicel was placed within the repair. Attention then was turned to the gallbladder and the cystic artery was identified In the standard fashion. We opened the gallbladder to distinguish the anatomy of the cystic Duct. but unfortunately there was complete obliteration of the cystic duct. At this time I decided that the safest thing to do was to come across the infundibulum of the gallbladder with a standard endoscopic echelon 60 mm blue stapler. I did this in order to prevent any injuries to the common bile.and to prevent any potential complications with dissection. A #19 Blake drain was placed within the fossa and sutured in place to the abdominal wall with 3-0 nylon was. Second look revealed adequate hemostasis of the repair and the laminas of any bile leak. We obtained x-rays prior to closing to rule out any retained foreign bodies. There was no evidence of any retained foreign bodies. marcaine was injected through the subcutaneous tissues tissue and fascia and the fascia was closed in a standard fashion with 0 pds suture. skin was closed with staples.   Sponge, lap, and needle counts were correct at closure and at the conclusion of the case.               Caroleen Hamman, MD, FACS

## 2017-04-02 NOTE — Anesthesia Preprocedure Evaluation (Addendum)
Anesthesia Evaluation  Patient identified by MRN, date of birth, ID band Patient awake    Reviewed: Allergy & Precautions, NPO status , Patient's Chart, lab work & pertinent test results, reviewed documented beta blocker date and time   Airway Mallampati: II  TM Distance: >3 FB     Dental  (+) Upper Dentures, Lower Dentures   Pulmonary pneumonia, resolved, former smoker,           Cardiovascular hypertension, Pt. on medications      Neuro/Psych    GI/Hepatic GERD  Controlled,  Endo/Other  diabetes, Type 2  Renal/GU      Musculoskeletal  (+) Arthritis ,   Abdominal   Peds  Hematology   Anesthesia Other Findings   Reproductive/Obstetrics                            Anesthesia Physical Anesthesia Plan  ASA: III  Anesthesia Plan: General   Post-op Pain Management:    Induction: Intravenous  PONV Risk Score and Plan:   Airway Management Planned: Oral ETT  Additional Equipment:   Intra-op Plan:   Post-operative Plan:   Informed Consent: I have reviewed the patients History and Physical, chart, labs and discussed the procedure including the risks, benefits and alternatives for the proposed anesthesia with the patient or authorized representative who has indicated his/her understanding and acceptance.     Plan Discussed with: CRNA  Anesthesia Plan Comments:         Anesthesia Quick Evaluation

## 2017-04-02 NOTE — H&P (View-Only) (Signed)
Outpatient Surgical Follow Up  03/12/2017  [name] is an 83 y.o. male.   Chief Complaint  Patient presents with  . Follow-up    Cholecystostomy Drain  . Results    Cholangiogram    HPI: Benjamin Austin is a 83 year old well known to our practice with a history of cholecystitis and choledocholithiasis he is is status post ERCP and ch He did very well after ERCP. Images personally reviewed showing patency bile duct.he has been doing very well has been asymptomatic. No fevers no chills no abdominal pain. Cholecystostmy tube is widely open. I did review his vious cholangiogram showing functional tube with CBD stones  Past Medical History:  Diagnosis Date  . Arthritis   . Diabetes mellitus without complication ([place])   . Hypercholesteremia   . Hypertension     Past Surgical History:  Procedure Laterality Date  . APPENDECTOMY    . ERCP N/A 03/04/2017   Procedure: ENDOSCOPIC RETROGRADE CHOLANGIOPANCREATOGRAPHY (ERCP);  Surgeon: Lucilla Lame, MD;  Location: [facility] ENDOSCOPY;  Service: Endoscopy;  Laterality: N/A;  . HERNIA REPAIR    . IR GUIDED DRAIN W CATHETER PLACEMENT  01/18/2017    Family History  Problem Relation Age of Onset  . Heart disease Father     Social History:  reports that he has quit smoking. His smoking use included Cigars. He has never used smokeless tobacco. He reports that he does not drink alcohol or use drugs.  Allergies: No Known Allergies  Medications reviewed.    ROS Full ROS performed and is otherwise negative other than what is stated in HPI   BP 135/69   Pulse 82   Temp 98.2 F (36.8 C) (Oral)   Ht 5\' 8"  (1.727 m)   Wt 71.4 kg (157 lb 6.4 oz)   BMI 23.93 kg/m    Physical Exam  Constitutional: He is oriented to person, place, and time and well-developed, well-nourished, and in no distress. No distress.  Neck: No JVD present. No tracheal deviation present.  Cardiovascular: Normal rate and intact distal pulses.   Pulmonary/Chest: Effort normal.  No stridor. No respiratory distress. He exhibits no tenderness.  Abdominal: Soft. He exhibits no distension. There is no tenderness. There is no rebound and no guarding.  Chole tube in place w golden bile. No peritonitis  Musculoskeletal: Normal range of motion.  Neurological: He is alert and oriented to person, place, and time. Gait normal. GCS score is 15.  Skin: Skin is warm and dry. He is not diaphoretic.  Psychiatric: Mood, memory, affect and judgment normal.  Nursing note and vitals reviewed.  Assessment/Plan: History of cholecystitis and choledocholithiasis status post ERCP. The patient will need elective cholecystectomy w IOC d/ with him about the surgery. The risks, benefits, complications, treatment options, and expected outcomes were discussed with the patient. The possibilities of bleeding, recurrent infection, finding a normal gallbladder, perforation of viscus organs, damage to surrounding structures, bile leak, abscess formation, needing a drain placed, the need for additional procedures, reaction to medication, pulmonary aspiration,  failure to diagnose a condition, the possible need to convert to an open procedure, and creating a complication requiring transfusion or operation were discussed with the patient. The patient and/or family concurred with the proposed plan, giving informed consent.    Caroleen Hamman, MD [facility] General Surgeon

## 2017-04-02 NOTE — Anesthesia Procedure Notes (Signed)
Arterial Line Insertion Start/End10/24/2018 2:25 PM, 04/02/2017 2:30 PM Performed by: Angus Seller, CRNA  Patient location: Pre-op. Preanesthetic checklist: patient identified, IV checked, site marked, risks and benefits discussed, surgical consent, monitors and equipment checked, pre-op evaluation, timeout performed and anesthesia consent Lidocaine 1% used for infiltration radial was placed Catheter size: 20 Fr Hand hygiene performed  and maximum sterile barriers used   Attempts: 1 Procedure performed without using ultrasound guided technique. Following insertion, dressing applied. Post procedure assessment: normal and unchanged

## 2017-04-02 NOTE — Interval H&P Note (Signed)
History and Physical Interval Note:  04/02/2017 11:41 AM  Benjamin Austin  has presented today for surgery, with the diagnosis of GS  The various methods of treatment have been discussed with the patient and family. After consideration of risks, benefits and other options for treatment, the patient has consented to  Procedure(s): LAPAROSCOPIC CHOLECYSTECTOMY WITH INTRAOPERATIVE CHOLANGIOGRAM (N/A) as a surgical intervention .  The patient's history has been reviewed, patient examined, no change in status, stable for surgery.  I have reviewed the patient's chart and labs.  Questions were answered to the patient's satisfaction.     [place]

## 2017-04-02 NOTE — Anesthesia Post-op Follow-up Note (Signed)
Anesthesia QCDR form completed.        

## 2017-04-02 NOTE — Anesthesia Postprocedure Evaluation (Signed)
Anesthesia Post Note  Patient: Benjamin Austin  Procedure(s) Performed: LAPAROSCOPIC CHOLECYSTECTOMY WITH INTRAOPERATIVE CHOLANGIOGRAM (N/A Abdomen)  Patient location during evaluation: PACU Anesthesia Type: General Level of consciousness: awake and alert Pain management: pain level controlled Vital Signs Assessment: post-procedure vital signs reviewed and stable Respiratory status: spontaneous breathing, nonlabored ventilation, respiratory function stable and patient connected to nasal cannula oxygen Cardiovascular status: blood pressure returned to baseline and stable Postop Assessment: no apparent nausea or vomiting Anesthetic complications: no     Last Vitals:  Vitals:   04/02/17 1645 04/02/17 1708  BP:  118/75  Pulse: 95 97  Resp: 15 18  Temp: (!) 36.4 C (!) 36.4 C  SpO2: 98% 99%    Last Pain:  Vitals:   04/02/17 1708  TempSrc: Oral  PainSc:                  [name]

## 2017-04-02 NOTE — Consult Note (Signed)
Name: Benjamin Austin MRN: [id number] DOB: 1930-11-25    ADMISSION DATE:  04/02/2017 CONSULTATION DATE: 04/02/2017  REFERRING MD : Dr. Dahlia Byes   CHIEF COMPLAINT: S/P Open Cholecystectomy w/ Cholangiogram   BRIEF PATIENT DESCRIPTION:  83 yo old male admitted 10/24 s/p open cholecystectomy w/ cholangiogram secondary to chronic cholecystitis complicated by traction injury to the portal vein during surgery with acute blood loss   SIGNIFICANT EVENTS  10/24-Pt admitted to ICU   STUDIES:  DG Cholangiogram 10/24>>Grossly unchanged suspected nonocclusive choledocholithiasis within the distal aspect of the CBD, similar to the 02/11/2017 examination. Correlation with the operative report is recommended. Further evaluation with ERCP could be performed as indicated  HISTORY OF PRESENT ILLNESS:   This is an 84 yo male with a PMH of Tremors, Pneumonia. HTN, Hypercholesteremia, Common Bile Duct Gall Stones, Diabetes Mellitus, Arthritis, Cholecystitis and Choledocholithiasis s/p ERCP and Cholecystotomy Tube.  He presented to [facility] on 10/24 for an elective laparoscopic cholecystectomy with cholangiogram due to chronic cholecystitis, however due to traction injury to the portal vein during surgery surgeon converted case to an open cholecystectomy.  Due to acute blood loss of 2000 ml he received a total of 2 units of pRBC's and was admitted to ICU for further workup and treatment PCCM consulted for medical management.  PAST MEDICAL HISTORY :   has a past medical history of Arthritis; Diabetes mellitus without complication ([place]); Gall stones, common bile duct (01/18/2017); Hypercholesteremia; Hypertension; Pneumonia (1994); and Tremors of nervous system.  has a past surgical history that includes Hernia repair; Appendectomy; IR Guided Drain W Catheter Placement (01/18/2017); and ERCP (N/A, 03/04/2017). Prior to Admission medications   Medication Sig Start Date End Date Taking? Authorizing Provider  Calcium  Carbonate-Vitamin D 600-400 MG-UNIT tablet Take 1 tablet by mouth daily.   Yes [provider]  Chlorpheniramine Maleate (ALLERGY RELIEF PO) Take 1 tablet by mouth daily as needed (for allergies).   Yes [provider]  cyanocobalamin (,VITAMIN B-12,) 1000 MCG/ML injection Inject 1,000 mcg into the muscle every 30 (thirty) days.  05/18/14  Yes [provider]  Docusate Calcium (STOOL SOFTENER PO) Take 1 capsule by mouth every other day.   Yes [provider]  lisinopril (PRINIVIL,ZESTRIL) 20 MG tablet Take 20 mg by mouth daily. 11/07/16  Yes [provider]  metFORMIN (GLUCOPHAGE) 1000 MG tablet Take 1,000 mg by mouth 2 (two) times daily. 11/07/16  Yes [provider]  Omega-3 1000 MG CAPS Take 1,000 mg by mouth daily.   Yes [provider]  pravastatin (PRAVACHOL) 20 MG tablet Take 10 mg by mouth at bedtime. 11/07/16  Yes [provider]   No Known Allergies  FAMILY HISTORY:  family history includes Heart disease in his father. SOCIAL HISTORY:  reports that he has quit smoking. His smoking use included Cigars. He has never used smokeless tobacco. He reports that he does not drink alcohol or use drugs.  REVIEW OF SYSTEMS: Positives in BOLD Constitutional: Negative for fever, chills, weight loss, malaise/fatigue and diaphoresis.  HENT: Negative for hearing loss, ear pain, nosebleeds, congestion, sore throat, neck pain, tinnitus and ear discharge.   Eyes: Negative for blurred vision, double vision, photophobia, pain, discharge and redness.  Respiratory: Negative for cough, hemoptysis, sputum production, shortness of breath, wheezing and stridor.   Cardiovascular: Negative for chest pain, palpitations, orthopnea, claudication, leg swelling and PND.  Gastrointestinal: heartburn, nausea, vomiting, abdominal pain, diarrhea, constipation, blood in stool and melena.  Genitourinary: Negative for dysuria, urgency,  frequency, hematuria  and flank pain.  Musculoskeletal: Negative for myalgias, back pain, joint pain and falls.  Skin: Negative for itching and rash.  Neurological: Negative for dizziness, tingling, tremors, sensory change, speech change, focal weakness, seizures, loss of consciousness, weakness and headaches.  Endo/Heme/Allergies: Negative for environmental allergies and polydipsia. Does not bruise/bleed easily.  SUBJECTIVE:  Pt c/o abdominal pain at incision site.  VITAL SIGNS: Temp:  [97 F (36.1 C)-97.5 F (36.4 C)] 97.5 F (36.4 C) (10/24 1645) Pulse Rate:  [60-95] 95 (10/24 1645) Resp:  [11-18] 15 (10/24 1645) BP: (114-151)/(62-85) 116/70 (10/24 1639) SpO2:  [97 %-100 %] 98 % (10/24 1645) Arterial Line BP: (131-174)/(52-69) 139/55 (10/24 1645) Weight:  [71.7 kg (158 lb)] 71.7 kg (158 lb) (10/24 1052)  PHYSICAL EXAMINATION: General: well developed, well nourished male, NAD  Neuro: alert and oriented, follows commands  HEENT: supple, no JVD Cardiovascular: nsr, s1s2, no M/R/G Lungs: right lower lobe rhonchi all other lobes clear, even, non labored Abdomen: faint BS x4, tender, soft, non distended, midline abdominal incision dressing dry and intact, right quadrant JP drain intact  Musculoskeletal: normal bulk and tone, no edema  Skin: intact no rashes or lesions   Recent Labs Lab 04/02/17 1557  NA 141  K 3.7  CL 110  CO2 21*  BUN 21*  CREATININE 1.12  GLUCOSE 293*    Recent Labs Lab 04/02/17 1557  HGB 11.8*  HCT 36.3*  WBC 16.6*  PLT 173   Dg Abd 1 View  Result Date: 04/02/2017 CLINICAL DATA:  Pt in surgery and they wanted to check for foreign body or any retained objects from surgery. EXAM: ABDOMEN - 1 VIEW COMPARISON:  None. FINDINGS: There is a nasogastric tube with the tip projecting over the stomach. The bowel gas pattern is normal. There is a right upper quadrant drain. There are surgical staples in the right upper quadrant from cholecystectomy. There is a small amount of  contrast in the small bowel from intraoperative cholangiogram. No radio-opaque calculi or other significant radiographic abnormality are seen. There is no radiopaque foreign body. IMPRESSION: No radiopaque foreign body within the abdomen. Electronically Signed   By: Kathreen Devoid   On: 04/02/2017 15:08   Dg Cholangiogram Operative  Result Date: 04/02/2017 CLINICAL DATA:  History of acute calculus cholecystitis, post ultrasound fluoroscopic guided cholecystostomy tube placement on 01/18/2017. Intraoperative cholangiogram during laparoscopic cholecystectomy. EXAM: INTRAOPERATIVE CHOLANGIOGRAM FLUOROSCOPY TIME:  18 seconds COMPARISON:  Cholangiogram via existing cholecystostomy tube - 02/11/2017 FINDINGS: Intraoperative cholangiographic images of the right upper abdominal quadrant during laparoscopic cholecystectomy are provided for review. Contrast has been injected via the patient's existing percutaneous cholecystostomy tube. There several nonocclusive filling defects within the gallbladder compatible with gallstones and/or biliary sludge. Contrast injection demonstrates selective cannulation of the central aspect of the cystic duct. There is passage of contrast through the central aspect of the cystic duct with filling of a non dilated common bile duct. There is passage of contrast though the CBD and into the descending portion of the duodenum. There is minimal reflux of injected contrast into the common hepatic duct and central aspect of the non dilated intrahepatic biliary system. There is a persistent lentiform appearing nonocclusive filling defect within the distal aspect of the CBD again worrisome for choledocholithiasis, similar to cholangiogram performed 02/11/2017. IMPRESSION: Grossly unchanged suspected nonocclusive choledocholithiasis within the distal aspect of the CBD, similar to the 02/11/2017 examination. Correlation with the operative report is recommended. Further evaluation with ERCP could be  performed  as indicated. Electronically Signed   By: Sandi Mariscal M.D.   On: 04/02/2017 12:49    ASSESSMENT / PLAN: S/P Open Cholecystectomy w/ Cholangiogram 04/02/17 Acute blood loss anemia secondary to traction injury to portal vein  Postop pain  Diabetes Mellitus P: Supplemental O2 to maintain O2 sats >92% Pulmonary hygiene Continue LR @100  ml/hr Trend CBC Monitor coags  Monitor for s/sx of bleeding  Transfuse for hgb of <7  Prn morphine and oxycodone for pain management  CBG's ac/hs  SSI and scheduled novolog Primary management-surgical team   Marda Stalker, [place] Pager [phone] (please enter 7 digits) [place] Pager [phone] (please enter 7 digits)  Merton Border, MD PCCM service Mobile [phone] Pager [phone] 04/03/2017 2:35 PM

## 2017-04-02 NOTE — Transfer of Care (Signed)
Immediate Anesthesia Transfer of Care Note  Patient: Benjamin Austin  Procedure(s) Performed: LAPAROSCOPIC CHOLECYSTECTOMY WITH INTRAOPERATIVE CHOLANGIOGRAM (N/A Abdomen)  Patient Location: PACU  Anesthesia Type:General  Level of Consciousness: sedated  Airway & Oxygen Therapy: Patient Spontanous Breathing and Patient connected to face mask oxygen  Post-op Assessment: Report given to RN and Post -op Vital signs reviewed and stable  Post vital signs: Reviewed and stable  Last Vitals:  Vitals:   04/02/17 1052 04/02/17 1545  BP: (!) 148/80 114/62  Pulse: 68 66  Resp: 16 12  Temp: (!) 36.3 C (!) 36.1 C  SpO2: 100% 100%    Last Pain:  Vitals:   04/02/17 1545  TempSrc: Temporal         Complications: No apparent anesthesia complications

## 2017-04-02 NOTE — Anesthesia Procedure Notes (Signed)
Procedure Name: Intubation Date/Time: 04/02/2017 12:15 PM Performed by: Hedda Slade Pre-anesthesia Checklist: Patient identified, Patient being monitored, Timeout performed, Emergency Drugs available and Suction available Patient Re-evaluated:Patient Re-evaluated prior to induction Oxygen Delivery Method: Circle system utilized Preoxygenation: Pre-oxygenation with 100% oxygen Induction Type: IV induction Ventilation: Mask ventilation without difficulty Laryngoscope Size: Mac and 4 Grade View: Grade I Tube type: Oral Tube size: 7.5 mm Number of attempts: 1 Airway Equipment and Method: Stylet Placement Confirmation: ETT inserted through vocal cords under direct vision,  positive ETCO2 and breath sounds checked- equal and bilateral Secured at: 22 cm Tube secured with: Tape Dental Injury: Teeth and Oropharynx as per pre-operative assessment

## 2017-04-02 NOTE — Care Management (Signed)
Blood loss about 2063ml. Started 2 more IVs and an A-line. Neo drip Pt stable at present.

## 2017-04-03 ENCOUNTER — Encounter: Payer: Self-pay | Admitting: Surgery

## 2017-04-03 LAB — COMPREHENSIVE METABOLIC PANEL
ALBUMIN: 3.2 g/dL — AB (ref 3.5–5.0)
ALK PHOS: 37 U/L — AB (ref 38–126)
ALT: 78 U/L — ABNORMAL HIGH (ref 17–63)
ANION GAP: 10 (ref 5–15)
AST: 102 U/L — ABNORMAL HIGH (ref 15–41)
BUN: 22 mg/dL — ABNORMAL HIGH (ref 6–20)
CALCIUM: 8.2 mg/dL — AB (ref 8.9–10.3)
CO2: 23 mmol/L (ref 22–32)
Chloride: 108 mmol/L (ref 101–111)
Creatinine, Ser: 1.29 mg/dL — ABNORMAL HIGH (ref 0.61–1.24)
GFR calc Af Amer: 57 mL/min — ABNORMAL LOW (ref >60)
GFR calc non Af Amer: 49 mL/min — ABNORMAL LOW (ref >60)
Glucose, Bld: 257 mg/dL — ABNORMAL HIGH (ref 65–99)
Potassium: 4.6 mmol/L (ref 3.5–5.1)
SODIUM: 141 mmol/L (ref 135–145)
Total Bilirubin: 0.6 mg/dL (ref 0.3–1.2)
Total Protein: 6.1 g/dL — ABNORMAL LOW (ref 6.5–8.1)

## 2017-04-03 LAB — CBC
HEMATOCRIT: 32.9 % — AB (ref 40.0–52.0)
HEMOGLOBIN: 11.1 g/dL — AB (ref 13.0–18.0)
MCH: 28.4 pg (ref 26.0–34.0)
MCHC: 33.7 g/dL (ref 32.0–36.0)
MCV: 84.4 fL (ref 80.0–100.0)
Platelets: 158 10*3/uL (ref 150–440)
RBC: 3.89 MIL/uL — ABNORMAL LOW (ref 4.40–5.90)
RDW: 15.2 % — ABNORMAL HIGH (ref 11.5–14.5)
WBC: 15.8 10*3/uL — ABNORMAL HIGH (ref 3.8–10.6)

## 2017-04-03 LAB — APTT: APTT: 30 s (ref 24–36)

## 2017-04-03 LAB — HEMOGLOBIN A1C
HEMOGLOBIN A1C: 6.1 % — AB (ref 4.8–5.6)
MEAN PLASMA GLUCOSE: 128.37 mg/dL

## 2017-04-03 LAB — PROTIME-INR
INR: 1.19
PROTHROMBIN TIME: 15 s (ref 11.4–15.2)

## 2017-04-03 LAB — GLUCOSE, CAPILLARY
GLUCOSE-CAPILLARY: 168 mg/dL — AB (ref 65–99)
GLUCOSE-CAPILLARY: 147 mg/dL — AB (ref 65–99)
Glucose-Capillary: 237 mg/dL — ABNORMAL HIGH (ref 65–99)
Glucose-Capillary: 161 mg/dL — ABNORMAL HIGH (ref 65–99)

## 2017-04-03 MED ORDER — LACTATED RINGERS IV SOLN
INTRAVENOUS | Status: DC
  Administered 2017-04-03: 15:00:00 via INTRAVENOUS

## 2017-04-03 MED ORDER — DOCUSATE SODIUM 100 MG PO CAPS
200.0000 mg | ORAL_CAPSULE | Freq: Two times a day (BID) | ORAL | Status: DC
  Administered 2017-04-03 – 2017-04-09 (×12): 200 mg via ORAL
  Filled 2017-04-03 (×12): qty 2

## 2017-04-03 MED ORDER — GABAPENTIN 600 MG PO TABS
300.0000 mg | ORAL_TABLET | Freq: Two times a day (BID) | ORAL | Status: DC
  Administered 2017-04-03 – 2017-04-09 (×12): 300 mg via ORAL
  Filled 2017-04-03 (×12): qty 1

## 2017-04-03 NOTE — Care Management (Signed)
Admitted to icu post op open cholecystectomy with acute blood loss due to complication. Hemoglobin is stable

## 2017-04-03 NOTE — Progress Notes (Signed)
Arterial line and Foley d/ced per orders. Walked to chair. Sitting up without issues, Transferred to 208 via wheelchair.

## 2017-04-03 NOTE — Progress Notes (Signed)
POD # 1 Hb and HD stable AVSS Good UO Taking clears  PE NAD Abd: dressing intact, JP serosanguinous w brown tinged from the suricel. No peritonitis  A/P Doing well Xfer to floor Advance diet

## 2017-04-04 LAB — COMPREHENSIVE METABOLIC PANEL
ALT: 47 U/L (ref 17–63)
ANION GAP: 4 — AB (ref 5–15)
AST: 38 U/L (ref 15–41)
Albumin: 2.9 g/dL — ABNORMAL LOW (ref 3.5–5.0)
Alkaline Phosphatase: 37 U/L — ABNORMAL LOW (ref 38–126)
BUN: 19 mg/dL (ref 6–20)
CHLORIDE: 108 mmol/L (ref 101–111)
CO2: 26 mmol/L (ref 22–32)
Calcium: 7.8 mg/dL — ABNORMAL LOW (ref 8.9–10.3)
Creatinine, Ser: 1.25 mg/dL — ABNORMAL HIGH (ref 0.61–1.24)
GFR calc Af Amer: 59 mL/min — ABNORMAL LOW (ref >60)
GFR, EST NON AFRICAN AMERICAN: 51 mL/min — AB (ref >60)
Glucose, Bld: 155 mg/dL — ABNORMAL HIGH (ref 65–99)
POTASSIUM: 4 mmol/L (ref 3.5–5.1)
Sodium: 138 mmol/L (ref 135–145)
TOTAL PROTEIN: 5.7 g/dL — AB (ref 6.5–8.1)
Total Bilirubin: 0.6 mg/dL (ref 0.3–1.2)

## 2017-04-04 LAB — CBC
HCT: 26.2 % — ABNORMAL LOW (ref 40.0–52.0)
Hemoglobin: 8.7 g/dL — ABNORMAL LOW (ref 13.0–18.0)
MCH: 27.9 pg (ref 26.0–34.0)
MCHC: 33.1 g/dL (ref 32.0–36.0)
MCV: 84.3 fL (ref 80.0–100.0)
PLATELETS: 110 10*3/uL — AB (ref 150–440)
RBC: 3.11 MIL/uL — ABNORMAL LOW (ref 4.40–5.90)
RDW: 15.6 % — AB (ref 11.5–14.5)
WBC: 13.2 10*3/uL — AB (ref 3.8–10.6)

## 2017-04-04 LAB — GLUCOSE, CAPILLARY
Glucose-Capillary: 152 mg/dL — ABNORMAL HIGH (ref 65–99)
Glucose-Capillary: 163 mg/dL — ABNORMAL HIGH (ref 65–99)
Glucose-Capillary: 156 mg/dL — ABNORMAL HIGH (ref 65–99)
Glucose-Capillary: 125 mg/dL — ABNORMAL HIGH (ref 65–99)

## 2017-04-04 LAB — SURGICAL PATHOLOGY

## 2017-04-04 MED ORDER — MORPHINE SULFATE (PF) 2 MG/ML IV SOLN: 2.0000 mg | INTRAVENOUS | Status: DC | PRN

## 2017-04-04 MED ORDER — PRAVASTATIN SODIUM 10 MG PO TABS
10.0000 mg | ORAL_TABLET | Freq: Every day | ORAL | Status: DC
  Administered 2017-04-04 – 2017-04-08 (×5): 10 mg via ORAL
  Filled 2017-04-04 (×6): qty 1

## 2017-04-04 MED ORDER — LISINOPRIL 20 MG PO TABS
20.0000 mg | ORAL_TABLET | Freq: Every day | ORAL | Status: DC
  Administered 2017-04-04 – 2017-04-09 (×6): 20 mg via ORAL
  Filled 2017-04-04 (×6): qty 1

## 2017-04-04 NOTE — Plan of Care (Signed)
Problem: Safety: Goal: Ability to remain free from injury will improve Outcome: Progressing Safety precautions in place. Patient ambulating with supervision up to contact guard assist. No assistive devices. Patient has shuffling gait.  Problem: Health Behavior/Discharge Planning: Goal: Ability to manage health-related needs will improve Outcome: Progressing Patient eager to get home. Wife supportive and primary caretaker. Patient able to bathe self with setup and take care of toileting needs independently.  Problem: Pain Managment: Goal: General experience of comfort will improve Outcome: Progressing Patient denies any pain thus far in this shift. Patient reports some soreness with movement and is comfortable with the scheduled tylenol he is receiving.   Problem: Physical Regulation: Goal: Will remain free from infection Outcome: Progressing Patient has been afebrile this shift  Problem: Skin Integrity: Goal: Risk for impaired skin integrity will decrease Outcome: Progressing Patient skin intact, sporatic ecchymosis and fragile skin. Patient has a foam dressing on bottom to prevent pressure injury.  Problem: Activity: Goal: Risk for activity intolerance will decrease Outcome: Progressing Patient fearful of getting out of bed. Discussed with patient and wife need to increase mobility. Discussed plan for today to get up in chair for meals and ambulate in hall. Patient was up in chair for breakfast. Ate lunch in bed as he had just returned from a walk.   Problem: Nutrition: Goal: Adequate nutrition will be maintained Outcome: Progressing Patient eating <50% of meals. He reports not feeling very hungry. Wife at bedside and is ordering his meals. Patient  Encouraged to eat more, increase his mobility and drink and adequate amount of fluids  Problem: Bowel/Gastric: Goal: Will not experience complications related to bowel motility Outcome: Progressing Patient has good bowel sounds  however he has not passed gas or had a bowel movement. Discussed with patient and wife importance of movement in returning his bowel function.

## 2017-04-04 NOTE — Progress Notes (Signed)
POD # 2 Dropped 2 gms on hb, likelyfrom mobilization of third space  NO HD changes Good u/o Creat ok LFT normalizing JP 90cc  PE NAD, sitting up eating breakfast ( no orthostatic sxs) Abd: soft, dressing intact, no peritonitis, JP w some bile drainage. No infection CV: bounding radial pulse  A/p Doing well  Heplock No need for transfusion or re intervention Keep JP if bile does not improve over the weekend may need ERCP Mobilize

## 2017-04-05 ENCOUNTER — Inpatient Hospital Stay: Payer: Medicare HMO

## 2017-04-05 LAB — COMPREHENSIVE METABOLIC PANEL
ALBUMIN: 2.7 g/dL — AB (ref 3.5–5.0)
ALK PHOS: 59 U/L (ref 38–126)
ALT: 44 U/L (ref 17–63)
ANION GAP: 4 — AB (ref 5–15)
AST: 39 U/L (ref 15–41)
BILIRUBIN TOTAL: 1 mg/dL (ref 0.3–1.2)
BUN: 17 mg/dL (ref 6–20)
CALCIUM: 8 mg/dL — AB (ref 8.9–10.3)
CO2: 27 mmol/L (ref 22–32)
Chloride: 110 mmol/L (ref 101–111)
Creatinine, Ser: 1.23 mg/dL (ref 0.61–1.24)
GFR calc Af Amer: >60 mL/min (ref >60)
GFR, EST NON AFRICAN AMERICAN: 52 mL/min — AB (ref >60)
GLUCOSE: 123 mg/dL — AB (ref 65–99)
Potassium: 3.7 mmol/L (ref 3.5–5.1)
Sodium: 141 mmol/L (ref 135–145)
TOTAL PROTEIN: 6 g/dL — AB (ref 6.5–8.1)

## 2017-04-05 LAB — CBC
HEMATOCRIT: 24.9 % — AB (ref 40.0–52.0)
HEMOGLOBIN: 8.5 g/dL — AB (ref 13.0–18.0)
MCH: 29.2 pg (ref 26.0–34.0)
MCHC: 34.2 g/dL (ref 32.0–36.0)
MCV: 85.4 fL (ref 80.0–100.0)
Platelets: 107 10*3/uL — ABNORMAL LOW (ref 150–440)
RBC: 2.92 MIL/uL — ABNORMAL LOW (ref 4.40–5.90)
RDW: 15.6 % — ABNORMAL HIGH (ref 11.5–14.5)
WBC: 10.8 10*3/uL — ABNORMAL HIGH (ref 3.8–10.6)

## 2017-04-05 LAB — GLUCOSE, CAPILLARY
GLUCOSE-CAPILLARY: 142 mg/dL — AB (ref 65–99)
Glucose-Capillary: 128 mg/dL — ABNORMAL HIGH (ref 65–99)
Glucose-Capillary: 177 mg/dL — ABNORMAL HIGH (ref 65–99)
Glucose-Capillary: 130 mg/dL — ABNORMAL HIGH (ref 65–99)

## 2017-04-05 MED ORDER — GUAIFENESIN-DM 100-10 MG/5ML PO SYRP
10.0000 mL | ORAL_SOLUTION | ORAL | Status: DC | PRN
  Administered 2017-04-06 (×2): 10 mL via ORAL
  Filled 2017-04-05 (×2): qty 10

## 2017-04-05 MED ORDER — ALUM & MAG HYDROXIDE-SIMETH 200-200-20 MG/5ML PO SUSP
30.0000 mL | Freq: Four times a day (QID) | ORAL | Status: DC | PRN
  Administered 2017-04-05: 30 mL via ORAL
  Filled 2017-04-05: qty 30

## 2017-04-05 MED ORDER — TECHNETIUM TC 99M MEBROFENIN IV KIT
5.4300 | PACK | Freq: Once | INTRAVENOUS | Status: AC | PRN
  Administered 2017-04-05: 5.43 via INTRAVENOUS

## 2017-04-05 NOTE — Progress Notes (Signed)
Physical Therapy Evaluation Patient Details Name: Benjamin Austin MRN: [id number] DOB: April 17, 1931 Today's Date: 04/05/2017   History of Present Illness  Patient is an 83 y.o. male admitted on 02 Apr 2017 s/p cholecystectomy complicated by traction injury to portal vein. PMH includes tremors, pneumonia, HTN, hypercholestermia, gall stones, and DM.  Clinical Impression  Patient is a pleasant male admitted for above listed reasons. On evaluation, patient demonstrates good strength and dynamic balance, with independence in all aspects of bed mobility, transfers, and gait. Patient able to maintain conversation during walking without difficulty. Patient is deemed safe to return home with wife when medically indicated.    Follow Up Recommendations No PT follow up    Equipment Recommendations  None recommended by PT    Recommendations for Other Services       Precautions / Restrictions Precautions Precautions: Fall Restrictions Weight Bearing Restrictions: No      Mobility  Bed Mobility Overal bed mobility: Independent             General bed mobility comments: Patient performed bed mobility independently.  Transfers Overall transfer level: Independent Equipment used: None             General transfer comment: Patient performed sit to stand and stand to sit with good safety awareness. Denies dizziness.  Ambulation/Gait Ambulation/Gait assistance: Modified independent (Device/Increase time) Ambulation Distance (Feet): 375 Feet Assistive device: None       General Gait Details: Patient ambulates with modified independence. No LOB. Able to perform toileting independently.  Stairs            Wheelchair Mobility    Modified Rankin (Stroke Patients Only)       Balance Overall balance assessment: Modified Independent                                           Pertinent Vitals/Pain Pain Assessment: 0-10 Pain Score: 4  Pain Location: Chest  when coughing Pain Descriptors / Indicators: Aching Pain Intervention(s): Limited activity within patient's tolerance;Monitored during session    Home Living Family/patient expects to be discharged to:: Private residence Living Arrangements: Spouse/significant other Available Help at Discharge: Family;Available 24 hours/day Type of Home: House Home Access: Stairs to enter   [employer] of Steps: 3 Home Layout: One level Home Equipment: None      Prior Function Level of Independence: Independent               Hand Dominance        Extremity/Trunk Assessment   Upper Extremity Assessment Upper Extremity Assessment: Overall WFL for tasks assessed    Lower Extremity Assessment Lower Extremity Assessment: Overall WFL for tasks assessed       Communication   Communication: No difficulties  Cognition Arousal/Alertness: Awake/alert Behavior During Therapy: WFL for tasks assessed/performed Overall Cognitive Status: Within Functional Limits for tasks assessed                                        General Comments      Exercises     Assessment/Plan    PT Assessment Patent does not need any further PT services  PT Problem List         PT Treatment Interventions      PT Goals (Current goals can  be found in the Care Plan section)  Acute Rehab PT Goals Patient Stated Goal: "To go home" PT Goal Formulation: With patient/family Time For Goal Achievement: 04/19/17 Potential to Achieve Goals: Good    Frequency     Barriers to discharge        Co-evaluation               AM-PAC PT "6 Clicks" Daily Activity  Outcome Measure Difficulty turning over in bed (including adjusting bedclothes, sheets and blankets)?: None Difficulty moving from lying on back to sitting on the side of the bed? : None Difficulty sitting down on and standing up from a chair with arms (e.g., wheelchair, bedside commode, etc,.)?: None Help needed  moving to and from a bed to chair (including a wheelchair)?: None Help needed walking in hospital room?: None Help needed climbing 3-5 steps with a railing? : None 6 Click Score: 24    End of Session Equipment Utilized During Treatment: Gait belt Activity Tolerance: Patient tolerated treatment well Patient left: in chair;with call bell/phone within reach;with chair alarm set;with family/visitor present   PT Visit Diagnosis: Muscle weakness (generalized) (M62.81)    Time: 3704-8889 PT Time Calculation (min) (ACUTE ONLY): 15 min   Charges:   PT Evaluation $PT Eval Low Complexity: 1 Low     PT G Codes:   PT G-Codes **NOT FOR INPATIENT CLASS** Functional Assessment Tool Used: AM-PAC 6 Clicks Basic Mobility;Clinical judgement Functional Limitation: Mobility: Walking and moving around Mobility: Walking and Moving Around Current Status (V6945): At least 1 percent but less than 20 percent impaired, limited or restricted Mobility: Walking and Moving Around Goal Status [phone]): At least 1 percent but less than 20 percent impaired, limited or restricted Mobility: Walking and Moving Around Discharge Status [phone]): At least 1 percent but less than 20 percent impaired, limited or restricted      Dorice Lamas, PT, DPT 04/05/2017, 11:41 AM

## 2017-04-05 NOTE — Progress Notes (Signed)
[place] Hospital Day(s): 3.   Post op day(s): 3 Days Post-Op.   Interval History: Patient seen and examined, no acute events or new complaints overnight. Patient reports his pain is controlled except hurts more when he coughs, and he has been tolerating a regular diet and ambulating in the halls. He otherwise denies fever/chills, N/V, CP, SOB, lightheadedness or dizziness. Green fluid was emptied from his drain by RN each shift.  Review of Systems:  Constitutional: denies fever, chills  HEENT: denies cough or congestion  Respiratory: denies any shortness of breath  Cardiovascular: denies chest pain or palpitations  Gastrointestinal: abdominal pain, N/V, and bowel function as per interval history Genitourinary: denies burning with urination or urinary frequency Musculoskeletal: denies pain, decreased motor or sensation Integumentary: denies any other rashes or skin discolorations except post-surgical abdominal wounds Neurological: denies HA or vision/hearing changes   Vital signs in last 24 hours: [min-max] current  Temp:  [97.8 F (36.6 C)-99 F (37.2 C)] 98.7 F (37.1 C) (10/27 0443) Pulse Rate:  [83-101] 83 (10/27 0443) Resp:  [18-20] 18 (10/27 0443) BP: (121-134)/(52-68) 126/68 (10/27 0443) SpO2:  [92 %-94 %] 94 % (10/27 0443)     Height: 5\' 8"  (172.7 cm) Weight: 162 lb 0.6 oz (73.5 kg) BMI (Calculated): 24.64   Intake/Output this shift:  No intake/output data recorded. ~30 mL bilious fluid from patient's surgically placed drain per shift (25 - 35 mL)  Intake/Output last 2 shifts:  @IOLAST2SHIFTS @   Physical Exam:  Constitutional: alert, cooperative and no distress  HENT: normocephalic without obvious abnormality  Eyes: PERRL, EOM's grossly intact and symmetric  Neuro: CN II - XII grossly intact and symmetric without deficit  Respiratory: breathing non-labored at rest  Cardiovascular: regular rate and sinus rhythm  Gastrointestinal: soft and  non-distended with moderate peri-incisional and peri-drain tenderness to palpation without surrounding erythema or drainage, bilious green-appearing fluid in patient's drain, no guarding or rebound tenderness Musculoskeletal: UE and LE FROM, no edema or wounds, motor and sensation grossly intact, NT   Labs:  CBC Latest Ref Rng & Units 04/05/2017 04/04/2017 04/03/2017  WBC 3.8 - 10.6 K/uL 10.8(H) 13.2(H) 15.8(H)  Hemoglobin 13.0 - 18.0 g/dL 8.5(L) 8.7(L) 11.1(L)  Hematocrit 40.0 - 52.0 % 24.9(L) 26.2(L) 32.9(L)  Platelets 150 - 440 K/uL 107(L) 110(L) 158   CMP Latest Ref Rng & Units 04/05/2017 04/04/2017 04/03/2017  Glucose 65 - 99 mg/dL 123(H) 155(H) 257(H)  BUN 6 - 20 mg/dL 17 19 22(H)  Creatinine 0.61 - 1.24 mg/dL 1.23 1.25(H) 1.29(H)  Sodium 135 - 145 mmol/L 141 138 141  Potassium 3.5 - 5.1 mmol/L 3.7 4.0 4.6  Chloride 101 - 111 mmol/L 110 108 108  CO2 22 - 32 mmol/L 27 26 23   Calcium 8.9 - 10.3 mg/dL 8.0(L) 7.8(L) 8.2(L)  Total Protein 6.5 - 8.1 g/dL 6.0(L) 5.7(L) 6.1(L)  Total Bilirubin 0.3 - 1.2 mg/dL 1.0 0.6 0.6  Alkaline Phos 38 - 126 U/L 59 37(L) 37(L)  AST 15 - 41 U/L 39 38 102(H)  ALT 17 - 63 U/L 44 47 78(H)   Imaging studies: No new pertinent imaging studies   Assessment/Plan: (ICD-10's: K81.1) 83 y.o. male with bilious drainage into surgically placed drain, otherwise doing well with stable Hb, 3 Days Post-Op s/p laparoscopic converted to open cholecystectomy for chronic cholecystitis complicated by intra-operative portal vein injury with 2000 mL estimated blood loss, complicated by pertinent comorbidities including DM, HTN, hypercholesterolemia, osteoarthritis, and former tobacco abuse (cigars).   -  pain control prn, minimize narcotics  - continue regular diet with PO medications  - check HIDA to assess for source of bilious drainage  - medical management of medical comorbidities  - DVT prophylaxis, ambulation encouraged  All of the above findings and recommendations  were discussed with the patient, patient's wife, and patient's RN, and all of patient's and family's questions were answered to their expressed satisfaction.  -- Marilynne Drivers Rosana Hoes, MD, [place]: [facility] - Partnering for exceptional care. Office: [phone]

## 2017-04-06 DIAGNOSIS — K838 Other specified diseases of biliary tract: Secondary | ICD-10-CM

## 2017-04-06 LAB — COMPREHENSIVE METABOLIC PANEL
ALBUMIN: 2.6 g/dL — AB (ref 3.5–5.0)
ALK PHOS: 72 U/L (ref 38–126)
ALT: 47 U/L (ref 17–63)
ANION GAP: 8 (ref 5–15)
AST: 38 U/L (ref 15–41)
BILIRUBIN TOTAL: 0.8 mg/dL (ref 0.3–1.2)
BUN: 14 mg/dL (ref 6–20)
CALCIUM: 8.1 mg/dL — AB (ref 8.9–10.3)
CO2: 26 mmol/L (ref 22–32)
CREATININE: 1.11 mg/dL (ref 0.61–1.24)
Chloride: 107 mmol/L (ref 101–111)
GFR calc Af Amer: >60 mL/min (ref >60)
GFR calc non Af Amer: 59 mL/min — ABNORMAL LOW (ref >60)
GLUCOSE: 127 mg/dL — AB (ref 65–99)
Potassium: 3.5 mmol/L (ref 3.5–5.1)
Sodium: 141 mmol/L (ref 135–145)
TOTAL PROTEIN: 6.2 g/dL — AB (ref 6.5–8.1)

## 2017-04-06 LAB — GLUCOSE, CAPILLARY
GLUCOSE-CAPILLARY: 136 mg/dL — AB (ref 65–99)
GLUCOSE-CAPILLARY: 136 mg/dL — AB (ref 65–99)
Glucose-Capillary: 225 mg/dL — ABNORMAL HIGH (ref 65–99)

## 2017-04-06 LAB — CBC
HEMATOCRIT: 26.4 % — AB (ref 40.0–52.0)
HEMOGLOBIN: 8.9 g/dL — AB (ref 13.0–18.0)
MCH: 28.6 pg (ref 26.0–34.0)
MCHC: 33.5 g/dL (ref 32.0–36.0)
MCV: 85.3 fL (ref 80.0–100.0)
Platelets: 128 10*3/uL — ABNORMAL LOW (ref 150–440)
RBC: 3.1 MIL/uL — AB (ref 4.40–5.90)
RDW: 15.4 % — ABNORMAL HIGH (ref 11.5–14.5)
WBC: 10.3 10*3/uL (ref 3.8–10.6)

## 2017-04-06 NOTE — Progress Notes (Signed)
[place] [facility] Day(s): 4.   Post op day(s): 4 Days Post-Op.   Interval History: Patient seen and examined, no acute events or new complaints overnight. Patient reports his pain has been controlled and improving with +flatus and tolerating regular diet, denies N/V, fever/chills, CP, or SOB. Green fluid continues to fill his drain.  Review of Systems:  Constitutional: denies fever, chills  HEENT: denies cough or congestion  Respiratory: denies any shortness of breath  Cardiovascular: denies chest pain or palpitations  Gastrointestinal: abdominal pain, N/V, and bowel function as per interval history Genitourinary: denies burning with urination or urinary frequency Musculoskeletal: denies pain, decreased motor or sensation Integumentary: denies any other rashes or skin discolorations except post-surgical abdominal wounds Neurological: denies HA or vision/hearing changes   Vital signs in last 24 hours: [min-max] current  Temp:  [98.8 F (37.1 C)-100.2 F (37.9 C)] 99.6 F (37.6 C) (10/28 0431) Pulse Rate:  [81-103] 86 (10/28 0458) Resp:  [18-20] 20 (10/28 0431) BP: (133-170)/(64-76) 151/69 (10/28 0458) SpO2:  [89 %-98 %] 98 % (10/28 0431)     Height: 5\' 8"  (172.7 cm) Weight: 162 lb 0.6 oz (73.5 kg) BMI (Calculated): 24.64   Intake/Output this shift:  No intake/output data recorded.   Intake/Output last 2 shifts:  @IOLAST2SHIFTS @   Physical Exam:  Constitutional: alert, cooperative and no distress  HENT: normocephalic without obvious abnormality  Eyes: PERRL, EOM's grossly intact and symmetric  Neuro: CN II - XII grossly intact and symmetric without deficit  Respiratory: breathing non-labored at rest  Cardiovascular: regular rate and sinus rhythm  Gastrointestinal: soft and non-distended with mild-/moderate RUQ peri-incisional tenderness to palpation, no peri-incisional erythema or drainage, surgically placed drain well-secured with bilious dark green fluid  filling the attached bulb Musculoskeletal: UE and LE FROM, motor and sensation grossly intact, NT   Labs:  CBC Latest Ref Rng & Units 04/06/2017 04/05/2017 04/04/2017  WBC 3.8 - 10.6 K/uL 10.3 10.8(H) 13.2(H)  Hemoglobin 13.0 - 18.0 g/dL 8.9(L) 8.5(L) 8.7(L)  Hematocrit 40.0 - 52.0 % 26.4(L) 24.9(L) 26.2(L)  Platelets 150 - 440 K/uL 128(L) 107(L) 110(L)   CMP Latest Ref Rng & Units 04/06/2017 04/05/2017 04/04/2017  Glucose 65 - 99 mg/dL 127(H) 123(H) 155(H)  BUN 6 - 20 mg/dL 14 17 19   Creatinine 0.61 - 1.24 mg/dL 1.11 1.23 1.25(H)  Sodium 135 - 145 mmol/L 141 141 138  Potassium 3.5 - 5.1 mmol/L 3.5 3.7 4.0  Chloride 101 - 111 mmol/L 107 110 108  CO2 22 - 32 mmol/L 26 27 26   Calcium 8.9 - 10.3 mg/dL 8.1(L) 8.0(L) 7.8(L)  Total Protein 6.5 - 8.1 g/dL 6.2(L) 6.0(L) 5.7(L)  Total Bilirubin 0.3 - 1.2 mg/dL 0.8 1.0 0.6  Alkaline Phos 38 - 126 U/L 72 59 37(L)  AST 15 - 41 U/L 38 39 38  ALT 17 - 63 U/L 47 44 47   Imaging studies:  [facility] nuclear medicine imaging (04/05/2017) Prompt uptake and biliary excretion of activity by the liver is seen. No gallbladder activity seen, consistent with a prior cholecystectomy.  Biliary activity passes promptly into small bowel, consistent with patent common bile duct. No extravasation of radiopharmaceutical activity identified.  Assessment/Plan: (ICD-10's: K81.1) 83 y.o. male with bilious drainage into surgically placed drain, otherwise doing well with stable Hb, 4 Days Post-Op s/p laparoscopic converted to open cholecystectomy for chronic cholecystitis complicated by intra-operative portal vein injury with 2000 mL estimated blood loss, complicated by pertinent comorbidities including DM, HTN, hypercholesterolemia, osteoarthritis, and former  tobacco abuse (cigars).              - pain control prn, minimize narcotics             - continue regular diet with PO medications             - despite non-visualization of biliary leak (cystic duct stump vs  duct of [place]), biliary leak remains most likely cause of bilious drainage  - discussed with Dr. Allen Norris, NPO after midnight for ERCP tomorrow (Monday, 10/29)             - medical management of medical comorbidities             - DVT prophylaxis, ambulation encouraged  All of the above findings and recommendations were discussed with the patient, patient's wife, and patient's RN, and all of patient's and family's questions were answered to their expressed satisfaction.  -- Marilynne Drivers Rosana Hoes, MD, [place]: [facility] - Partnering for exceptional care. Office: [phone]

## 2017-04-06 NOTE — Consult Note (Signed)
Lucilla Lame, MD [facility]  [address] [place], [place] [address] Phone: [phone] Fax : [phone]  Consultation  Referring Provider:     Dr. Rosana Hoes Primary Care Physician:  Dion Body, MD Primary Gastroenterologist: Althia Forts         Reason for Consultation:     Bile duct leak  Date of Admission:  04/02/2017 Date of Consultation:  04/06/2017         HPI:   Benjamin Austin is a 83 y.o. male who recently had a laparoscopic cholecystectomy that needed to be converted to open cholecystectomy after there was an injury to a blood vessel.  The patient reports that his surgery was 5 days ago.  The patient had been doing well but the patient continues to have bile leakage from his drain.  The patient had an ERCP by me preoperatively that showed a periampullary diverticula with a sphincterotomy done and a stone extracted.  I am now being asked to see the patient for a repeat ERCP with stent placement for a bile duct leak.  Past Medical History:  Diagnosis Date  . Arthritis   . Diabetes mellitus without complication ([place])   . Gall stones, common bile duct 01/18/2017   Gallbladder infection   . Hypercholesteremia   . Hypertension   . Pneumonia 1994  . Tremors of nervous system     Past Surgical History:  Procedure Laterality Date  . APPENDECTOMY    . CHOLECYSTECTOMY N/A 04/02/2017   Procedure: LAPAROSCOPIC CHOLECYSTECTOMY WITH INTRAOPERATIVE CHOLANGIOGRAM;  Surgeon: Jules Husbands, MD;  Location: [facility] ORS;  Service: General;  Laterality: N/A;  . ERCP N/A 03/04/2017   Procedure: ENDOSCOPIC RETROGRADE CHOLANGIOPANCREATOGRAPHY (ERCP);  Surgeon: Lucilla Lame, MD;  Location: [facility] ENDOSCOPY;  Service: Endoscopy;  Laterality: N/A;  . HERNIA REPAIR    . IR GUIDED DRAIN W CATHETER PLACEMENT  01/18/2017    Prior to Admission medications   Medication Sig Start Date End Date Taking? Authorizing Provider  Calcium Carbonate-Vitamin D 600-400 MG-UNIT tablet Take 1 tablet by  mouth daily.   Yes [provider]  Chlorpheniramine Maleate (ALLERGY RELIEF PO) Take 1 tablet by mouth daily as needed (for allergies).   Yes [provider]  cyanocobalamin (,VITAMIN B-12,) 1000 MCG/ML injection Inject 1,000 mcg into the muscle every 30 (thirty) days.  05/18/14  Yes [provider]  Docusate Calcium (STOOL SOFTENER PO) Take 1 capsule by mouth every other day.   Yes [provider]  lisinopril (PRINIVIL,ZESTRIL) 20 MG tablet Take 20 mg by mouth daily. 11/07/16  Yes [provider]  metFORMIN (GLUCOPHAGE) 1000 MG tablet Take 1,000 mg by mouth 2 (two) times daily. 11/07/16  Yes [provider]  Omega-3 1000 MG CAPS Take 1,000 mg by mouth daily.   Yes [provider]  pravastatin (PRAVACHOL) 20 MG tablet Take 10 mg by mouth at bedtime. 11/07/16  Yes [provider]    Family History  Problem Relation Age of Onset  . Heart disease Father      Social History  Substance Use Topics  . Smoking status: Former Smoker    Types: Cigars  . Smokeless tobacco: Never Used  . Alcohol use No    Allergies as of 03/12/2017  . (No Known Allergies)    Review of Systems:    All systems reviewed and negative except where noted in HPI.   Physical Exam:  Vital signs in last 24 hours: Temp:  [98.8 F (37.1 C)-100.2 F (  37.9 C)] 99.6 F (37.6 C) (10/28 0431) Pulse Rate:  [81-103] 86 (10/28 0458) Resp:  [18-20] 20 (10/28 0431) BP: (133-170)/(64-76) 151/69 (10/28 0458) SpO2:  [89 %-98 %] 98 % (10/28 0431) Last BM Date: 04/01/17 General:   Pleasant, cooperative in NAD Head:  Normocephalic and atraumatic. Eyes:   No icterus.   Conjunctiva pink. PERRLA. Ears:  Normal auditory acuity. Neck:  Supple; no masses or thyroidomegaly Lungs: Respirations even and unlabored. Lungs clear to auscultation bilaterally.   No wheezes, crackles, or rhonchi.  Heart:  Regular rate and rhythm;  Without murmur, clicks, rubs or  gallops Abdomen:  Soft, nondistended, diffusely tender with a midline scar. Normal bowel sounds. No appreciable masses or hepatomegaly.  No rebound or guarding.  Rectal:  Not performed. Msk:  Symmetrical without gross deformities.    Extremities:  Without edema, cyanosis or clubbing. Neurologic:  Alert and oriented x3;  grossly normal neurologically. Skin:  Intact without significant lesions or rashes. Cervical Nodes:  No significant cervical adenopathy. Psych:  Alert and cooperative. Normal affect.  LAB RESULTS:  Recent Labs  04/04/17 0353 04/05/17 0351 04/06/17 0353  WBC 13.2* 10.8* 10.3  HGB 8.7* 8.5* 8.9*  HCT 26.2* 24.9* 26.4*  PLT 110* 107* 128*   BMET  Recent Labs  04/04/17 0353 04/05/17 0351 04/06/17 0353  NA 138 141 141  K 4.0 3.7 3.5  CL 108 110 107  CO2 26 27 26   GLUCOSE 155* 123* 127*  BUN 19 17 14   CREATININE 1.25* 1.23 1.11  CALCIUM 7.8* 8.0* 8.1*   LFT  Recent Labs  04/06/17 0353  PROT 6.2*  ALBUMIN 2.6*  AST 38  ALT 47  ALKPHOS 72  BILITOT 0.8   PT/INR No results for input(s): LABPROT, INR in the last 72 hours.  STUDIES: Nm Hepatobiliary Liver Func  Result Date: 04/05/2017 CLINICAL DATA:  Status post cholecystectomy 3 days ago. Worsening abdominal pain. Evaluate for postop bile leak. EXAM: NUCLEAR MEDICINE HEPATOBILIARY IMAGING TECHNIQUE: Sequential images of the abdomen were obtained out to 60 minutes following intravenous administration of radiopharmaceutical. RADIOPHARMACEUTICALS:  5.4 mCi Tc-20m  Choletec IV COMPARISON:  None. FINDINGS: Prompt uptake and biliary excretion of activity by the liver is seen. No gallbladder activity seen, consistent with a prior cholecystectomy. Biliary activity passes promptly into small bowel, consistent with patent common bile duct. No extravasation of radiopharmaceutical activity identified. IMPRESSION: Normal study status post cholecystectomy. No evidence of postop bile leak or biliary obstruction.  Electronically Signed   By: Earle Gell M.D.   On: 04/05/2017 14:38      Impression / Plan:   Benjamin Austin is a 83 y.o. y/o male with a bile duct leak after having an open cholecystectomy.  The patient has had an ERCP with a stone extraction by me in the past.  The patient and his wife have been explained that with the continued drainage from his cholecystectomy drain that he likely has a leak from either of the cystic duct stump or from the liver bed.  The patient will be set up for an ERCP for tomorrow for a stent placement.  The patient and his wife have been explained the procedure and the plan and agree with it.  Thank you for involving me in the care of this patient.      LOS: 4 days   Lucilla Lame, MD  04/06/2017, 10:02 AM   Note: This dictation was prepared with Dragon dictation along with smaller phrase technology. Any transcriptional  errors that result from this process are unintentional.

## 2017-04-07 ENCOUNTER — Inpatient Hospital Stay: Payer: Medicare HMO | Admitting: Anesthesiology

## 2017-04-07 ENCOUNTER — Inpatient Hospital Stay: Payer: Medicare HMO

## 2017-04-07 ENCOUNTER — Encounter
Admission: AD | Disposition: A | Discharge status: Home / Self Care | Payer: Self-pay | Source: Ambulatory Visit | Attending: Surgery

## 2017-04-07 DIAGNOSIS — K839 Disease of biliary tract, unspecified: Secondary | ICD-10-CM

## 2017-04-07 DIAGNOSIS — K838 Other specified diseases of biliary tract: Secondary | ICD-10-CM

## 2017-04-07 HISTORY — PX: ERCP: SHX5425

## 2017-04-07 LAB — TYPE AND SCREEN
ABO/RH(D): B POS
Antibody Screen: NEGATIVE
Unit division: 0
Unit division: 0

## 2017-04-07 LAB — GLUCOSE, CAPILLARY
GLUCOSE-CAPILLARY: 112 mg/dL — AB (ref 65–99)
Glucose-Capillary: 137 mg/dL — ABNORMAL HIGH (ref 65–99)
Glucose-Capillary: 138 mg/dL — ABNORMAL HIGH (ref 65–99)
Glucose-Capillary: 220 mg/dL — ABNORMAL HIGH (ref 65–99)

## 2017-04-07 LAB — BPAM RBC
Blood Product Expiration Date: 201811142359
Blood Product Expiration Date: 201811142359
ISSUE DATE / TIME: 201810241421
ISSUE DATE / TIME: 201810241421
UNIT TYPE AND RH: 5100
UNIT TYPE AND RH: 5100

## 2017-04-07 SURGERY — ERCP, WITH INTERVENTION IF INDICATED
Anesthesia: General

## 2017-04-07 MED ORDER — PROPOFOL 500 MG/50ML IV EMUL
INTRAVENOUS | Status: AC
  Filled 2017-04-07: qty 50

## 2017-04-07 MED ORDER — FENTANYL CITRATE (PF) 100 MCG/2ML IJ SOLN
INTRAMUSCULAR | Status: AC
  Filled 2017-04-07: qty 2

## 2017-04-07 MED ORDER — PROPOFOL 10 MG/ML IV BOLUS
INTRAVENOUS | Status: AC
  Filled 2017-04-07: qty 20

## 2017-04-07 MED ORDER — PROPOFOL 10 MG/ML IV BOLUS
INTRAVENOUS | Status: DC | PRN
  Administered 2017-04-07: 80 mg via INTRAVENOUS

## 2017-04-07 MED ORDER — PROPOFOL 500 MG/50ML IV EMUL
INTRAVENOUS | Status: DC | PRN
  Administered 2017-04-07: 100 ug/kg/min via INTRAVENOUS

## 2017-04-07 MED ORDER — SODIUM CHLORIDE 0.9 % IV SOLN
INTRAVENOUS | Status: DC | PRN
  Administered 2017-04-07: 13:00:00 via INTRAVENOUS

## 2017-04-07 MED ORDER — FENTANYL CITRATE (PF) 100 MCG/2ML IJ SOLN
INTRAMUSCULAR | Status: DC | PRN
Start: 2017-04-07 — End: 2017-04-07
  Administered 2017-04-07 (×2): 25 ug via INTRAVENOUS
  Administered 2017-04-07: 50 ug via INTRAVENOUS

## 2017-04-07 NOTE — Anesthesia Postprocedure Evaluation (Signed)
Anesthesia Post Note  Patient: Benjamin Austin  Procedure(s) Performed: ENDOSCOPIC RETROGRADE CHOLANGIOPANCREATOGRAPHY (ERCP) (N/A )  Patient location during evaluation: PACU Anesthesia Type: General Level of consciousness: awake Pain management: pain level controlled Vital Signs Assessment: post-procedure vital signs reviewed and stable Respiratory status: spontaneous breathing Cardiovascular status: stable Anesthetic complications: no     Last Vitals:  Vitals:   04/07/17 1353 04/07/17 1403  BP: (!) 149/75 (!) 153/74  Pulse: 79 73  Resp: 13 10  Temp:    SpO2: 97% 99%    Last Pain:  Vitals:   04/07/17 1353  TempSrc:   PainSc: Asleep                 VAN STAVEREN,Kenisha Lynds

## 2017-04-07 NOTE — Care Management Important Message (Signed)
Important Message  Patient Details  Name: Benjamin Austin MRN: [id number] Date of Birth: 1930-10-01   Medicare Important Message Given:  Yes    Beverly Sessions, RN 04/07/2017, 4:55 PM

## 2017-04-07 NOTE — Progress Notes (Signed)
Day of Surgery   Subjective: Patient is now post ERCP.  He reports having a bit of an upset stomach but otherwise states his abdominal pain is the same.  States he is hungry.  ERCP did find a bile leak.  Vital signs in last 24 hours: Temp:  [97.1 F (36.2 C)-100.3 F (37.9 C)] 98.2 F (36.8 C) (10/29 1451) Pulse Rate:  [73-86] 82 (10/29 1451) Resp:  [10-19] 12 (10/29 1451) BP: (114-160)/(61-75) 155/69 (10/29 1451) SpO2:  [94 %-100 %] 96 % (10/29 1451) Last BM Date: 04/06/17  Intake/Output from previous day: 10/28 0701 - 10/29 0700 In: 600 [P.O.:600] Out: 660 [Urine:350; Drains:310]  GI: Abdomen is soft, appropriately tender at his incision sites, nondistended.  Bilious output in his right upper quadrant JP drain.  Midline dressing is clean, dry, intact.  Lab Results:  CBC  Recent Labs  04/05/17 0351 04/06/17 0353  WBC 10.8* 10.3  HGB 8.5* 8.9*  HCT 24.9* 26.4*  PLT 107* 128*   CMP     Component Value Date/Time   NA 141 04/06/2017 0353   K 3.5 04/06/2017 0353   CL 107 04/06/2017 0353   CO2 26 04/06/2017 0353   GLUCOSE 127 (H) 04/06/2017 0353   BUN 14 04/06/2017 0353   CREATININE 1.11 04/06/2017 0353   CALCIUM 8.1 (L) 04/06/2017 0353   PROT 6.2 (L) 04/06/2017 0353   ALBUMIN 2.6 (L) 04/06/2017 0353   AST 38 04/06/2017 0353   ALT 47 04/06/2017 0353   ALKPHOS 72 04/06/2017 0353   BILITOT 0.8 04/06/2017 0353   GFRNONAA 59 (L) 04/06/2017 0353   GFRAA >60 04/06/2017 0353   PT/INR No results for input(s): LABPROT, INR in the last 72 hours.  Studies/Results: Dg C-arm 1-60 Min-no Report  Result Date: 04/07/2017 Fluoroscopy was utilized by the requesting physician.  No radiographic interpretation.    Assessment/Plan: 83 year old male status post cholecystectomy comp gated by bile leak.  Bile leak confirmed by ERCP today with stent placement.  Discussed continuing him on a regular diet with the patient that this should be curative and allow his bile leak to seal  up.  Encourage ambulation, incentive spirometer usage.   Clayburn Pert, MD [facility]  Day [facility] [phone] Night [facility] [phone]  04/07/2017

## 2017-04-07 NOTE — Op Note (Signed)
[facility] Patient Name: Benjamin Austin Procedure Date: 04/07/2017 12:49 PM MRN: [id number] Account #: [id number] Date of Birth: 02-Nov-1930 Admit Type: Inpatient Age: 83 Room: [facility] ENDO ROOM 4 Gender: Male Note Status: Finalized Procedure:            ERCP Indications:          Bile leak Providers:            Lucilla Lame MD, MD Referring MD:         Dion Body (Referring MD) Medicines:            Propofol per Anesthesia Complications:        No immediate complications. Procedure:            Pre-Anesthesia Assessment:                       - Prior to the procedure, a History and Physical was                        performed, and patient medications and allergies were                        reviewed. The patient's tolerance of previous                        anesthesia was also reviewed. The risks and benefits of                        the procedure and the sedation options and risks were                        discussed with the patient. All questions were                        answered, and informed consent was obtained. Prior                        Anticoagulants: The patient has taken no previous                        anticoagulant or antiplatelet agents. ASA Grade                        Assessment: II - A patient with mild systemic disease.                        After reviewing the risks and benefits, the patient was                        deemed in satisfactory condition to undergo the                        procedure.                       After obtaining informed consent, the scope was passed                        under direct vision. Throughout the procedure, the  patient's blood pressure, pulse, and oxygen saturations                        were monitored continuously. The Endoscope was                        introduced through the mouth, and used to inject                        contrast into and used to  inject contrast into the bile                        duct. The ERCP was accomplished without difficulty. The                        patient tolerated the procedure well. Findings:      A scout film of the abdomen was obtained. One percutaneous drain ending       in the Right upper quadrant was seen. A biliary sphincterotomy had been       performed. The sphincterotomy appeared open. The major papilla was       located entirely within a diverticulum. The bile duct was deeply       cannulated with the short-nosed traction sphincterotome. Contrast was       injected. I personally interpreted the bile duct images. There was brisk       flow of contrast through the ducts. Image quality was excellent.       Contrast extended to the entire biliary tree. Extravasation of contrast       originating from the in the biliary system was observed. A wire was       passed into the biliary tree. The biliary sphincterotomy was extended       with a traction (standard) sphincterotome using ERBE electrocautery.       There was no post-sphincterotomy bleeding. One 10 Fr by 9 cm plastic       stent with a single external flap and a single internal flap was placed       7 cm into the common bile duct. Bile flowed through the stent. The stent       was in good position. One 7 Fr by 5 cm plastic stent with a single       external flap and a single internal flap was placed 3 cm into the common       bile duct. Bile flowed through the stent. The stent was in good position. Impression:           - Prior biliary endoscopic sphincterotomy appeared open.                       - The major papilla was located entirely within a                        diverticulum.                       - A bile leak was found.                       - A biliary sphincterotomy was performed.                       -  One plastic stent was placed into the common bile                        duct.                       - One plastic stent was  placed into the common bile                        duct. Recommendation:       - Watch for pancreatitis, bleeding, perforation, and                        cholangitis.                       - Repeat ERCP in 3 months to remove stent. Procedure Code(s):    --- Professional ---                       [phone], Endoscopic retrograde cholangiopancreatography                        (ERCP); with placement of endoscopic stent into biliary                        or pancreatic duct, including pre- and post-dilation                        and guide wire passage, when performed, including                        sphincterotomy, when performed, each stent                       43274, 76, Endoscopic retrograde                        cholangiopancreatography (ERCP); with placement of                        endoscopic stent into biliary or pancreatic duct,                        including pre- and post-dilation and guide wire                        passage, when performed, including sphincterotomy, when                        performed, each stent                       10258, Endoscopic catheterization of the biliary ductal                        system, radiological supervision and interpretation Diagnosis Code(s):    --- Professional ---                       K83.8, Other specified diseases of biliary tract                       K83.9, Disease of  biliary tract, unspecified CPT copyright 2016 American Medical Association. All rights reserved. The codes documented in this report are preliminary and upon coder review may  be revised to meet current compliance requirements. Lucilla Lame MD, MD 04/07/2017 1:39:29 PM This report has been signed electronically. Number of Addenda: 0 Note Initiated On: 04/07/2017 12:49 PM      [facility]

## 2017-04-07 NOTE — Anesthesia Preprocedure Evaluation (Signed)
Anesthesia Evaluation  Patient identified by MRN, date of birth, ID band Patient awake    Reviewed: Allergy & Precautions  Airway Mallampati: II       Dental  (+) Upper Dentures, Lower Dentures   Pulmonary former smoker,    breath sounds clear to auscultation       Cardiovascular hypertension,  Rhythm:Regular     Neuro/Psych    GI/Hepatic   Endo/Other  diabetes  Renal/GU      Musculoskeletal   Abdominal Normal abdominal exam  (+)   Peds  Hematology   Anesthesia Other Findings   Reproductive/Obstetrics                             Anesthesia Physical Anesthesia Plan  ASA: II  Anesthesia Plan: General   Post-op Pain Management:    Induction: Intravenous  PONV Risk Score and Plan: 0  Airway Management Planned: Natural Airway and Nasal Cannula  Additional Equipment:   Intra-op Plan:   Post-operative Plan:   Informed Consent: I have reviewed the patients History and Physical, chart, labs and discussed the procedure including the risks, benefits and alternatives for the proposed anesthesia with the patient or authorized representative who has indicated his/her understanding and acceptance.     Plan Discussed with: CRNA  Anesthesia Plan Comments:         Anesthesia Quick Evaluation

## 2017-04-07 NOTE — Anesthesia Post-op Follow-up Note (Signed)
Anesthesia QCDR form completed.        

## 2017-04-07 NOTE — Transfer of Care (Signed)
Immediate Anesthesia Transfer of Care Note  Patient: Benjamin Austin  Procedure(s) Performed: ENDOSCOPIC RETROGRADE CHOLANGIOPANCREATOGRAPHY (ERCP) (N/A )  Patient Location: PACU and Endoscopy Unit  Anesthesia Type:General  Level of Consciousness: drowsy and patient cooperative  Airway & Oxygen Therapy: Patient Spontanous Breathing and Patient connected to nasal cannula oxygen  Post-op Assessment: Report given to RN and Post -op Vital signs reviewed and stable  Post vital signs: Reviewed and stable  Last Vitals:  Vitals:   04/07/17 0432 04/07/17 1343  BP: 133/73 126/71  Pulse: 86 82  Resp: 17 11  Temp: 36.9 C (!) 36.2 C  SpO2: 98% 100%    Last Pain:  Vitals:   04/07/17 1343  TempSrc: Tympanic  PainSc: Asleep      Patients Stated Pain Goal: 0 (09/62/83 6629)  Complications: No apparent anesthesia complications

## 2017-04-08 LAB — COMPREHENSIVE METABOLIC PANEL
ALK PHOS: 83 U/L (ref 38–126)
ALT: 23 U/L (ref 17–63)
ANION GAP: 8 (ref 5–15)
AST: 16 U/L (ref 15–41)
Albumin: 2.5 g/dL — ABNORMAL LOW (ref 3.5–5.0)
BUN: 14 mg/dL (ref 6–20)
CALCIUM: 8.1 mg/dL — AB (ref 8.9–10.3)
CO2: 25 mmol/L (ref 22–32)
Chloride: 105 mmol/L (ref 101–111)
Creatinine, Ser: 1.08 mg/dL (ref 0.61–1.24)
GFR calc non Af Amer: >60 mL/min (ref >60)
Glucose, Bld: 129 mg/dL — ABNORMAL HIGH (ref 65–99)
Potassium: 3.1 mmol/L — ABNORMAL LOW (ref 3.5–5.1)
SODIUM: 138 mmol/L (ref 135–145)
TOTAL PROTEIN: 6.7 g/dL (ref 6.5–8.1)
Total Bilirubin: 0.9 mg/dL (ref 0.3–1.2)

## 2017-04-08 LAB — CBC
HCT: 26.7 % — ABNORMAL LOW (ref 40.0–52.0)
HEMOGLOBIN: 8.8 g/dL — AB (ref 13.0–18.0)
MCH: 28.3 pg (ref 26.0–34.0)
MCHC: 33.1 g/dL (ref 32.0–36.0)
MCV: 85.6 fL (ref 80.0–100.0)
Platelets: 199 10*3/uL (ref 150–440)
RBC: 3.12 MIL/uL — ABNORMAL LOW (ref 4.40–5.90)
RDW: 15.4 % — AB (ref 11.5–14.5)
WBC: 10.1 10*3/uL (ref 3.8–10.6)

## 2017-04-08 LAB — GLUCOSE, CAPILLARY
GLUCOSE-CAPILLARY: 170 mg/dL — AB (ref 65–99)
GLUCOSE-CAPILLARY: 196 mg/dL — AB (ref 65–99)
GLUCOSE-CAPILLARY: 140 mg/dL — AB (ref 65–99)
Glucose-Capillary: 132 mg/dL — ABNORMAL HIGH (ref 65–99)
Glucose-Capillary: 193 mg/dL — ABNORMAL HIGH (ref 65–99)

## 2017-04-08 NOTE — Plan of Care (Signed)
Problem: Nutrition: Goal: Ability to attain and maintain optimal nutritional status will improve Outcome: Progressing Regard previous entry with diet specified as regular, this was error. Patient diet is soft and intake is sufficient.

## 2017-04-08 NOTE — Plan of Care (Signed)
Problem: Nutrition: Goal: Adequate nutrition will be maintained Outcome: Progressing Patient on regular diet and intake is sufficient.

## 2017-04-09 ENCOUNTER — Telehealth: Payer: Self-pay

## 2017-04-09 ENCOUNTER — Encounter: Payer: Self-pay | Admitting: Gastroenterology

## 2017-04-09 LAB — GLUCOSE, CAPILLARY
GLUCOSE-CAPILLARY: 118 mg/dL — AB (ref 65–99)
GLUCOSE-CAPILLARY: 202 mg/dL — AB (ref 65–99)

## 2017-04-09 MED ORDER — OXYCODONE HCL 5 MG PO TABS
5.0000 mg | ORAL_TABLET | ORAL | 0 refills | Status: DC | PRN
Start: 2017-04-09 — End: 2017-12-01

## 2017-04-09 NOTE — Progress Notes (Signed)
IV and tele were removed. Discharge instructions, follow-up appointments, and prescriptions were provided to the pt and wife at bedside. All questions answered.The pt was taken downstairs via wheelchair by volunteers.

## 2017-04-09 NOTE — Telephone Encounter (Signed)
Post-op call made to patient at this time. Spoke with patient. Post-op interview questions below.  1. How are you feeling? Not too good. Patient stated that he feels stiffed.  2. Is your pain controlled? No  3. What are you doing for the pain? Not taking the narcotics but takes ibuprofen 800 MG three times a day.  4. Are you having any Nausea or Vomiting? No  5. Are you having any Fever or Chills? No  6. Are you having any Constipation or Diarrhea? Yes, at times. Have not had a BM for two days.   7. Is there any Swelling or Bruising you are concerned about? No  8. Do you have any questions or concerns at this time? Not at this time.   Discussion:Since patient has had a bit of a difficulty having a bowel movement. I recommended for him to get Miralax OTC 17 Grams twice a day until he has a bowel movement again. Then he is able to take 17 Grams once a day to keep him regular. I also recommended for him to walk around the house and drink plenty of fluids. Patient agreed with the recommendations. I also reminded patient about his appointment with Dr. Dahlia Byes on 04/14/2017 and stated that he would come in. Patient had no further questions.

## 2017-04-09 NOTE — Plan of Care (Signed)
Problem: Education: Goal: Knowledge of the prescribed therapeutic regimen will improve Outcome: Progressing Drain education completed with pt and wife at bedside.

## 2017-04-09 NOTE — Discharge Summary (Signed)
Patient ID: Benjamin Austin MRN: [id number] DOB/AGE: 81-Jan-1932 81 y.o.  Admit date: 04/02/2017 Discharge date: 04/09/2017  Discharge Diagnoses:  Bile leak post cholecystectomy  Procedures Performed: ERCP  Discharged Condition: good  Hospital Course: Patient re-admitted after a cholecystectomy and found to have a bile leak. Underwent an ERCP this hospital stay. On day of discharge he was having continued, but decreasing bile drainage from his closed suction drain. His abdomen was otherwise benign and he was tolerating a regular diet.  Discharge Orders: Discharge Instructions    Call MD for:  difficulty breathing, headache or visual disturbances    Complete by:  As directed    Call MD for:  persistant dizziness or light-headedness    Complete by:  As directed    Call MD for:  persistant nausea and vomiting    Complete by:  As directed    Call MD for:  redness, tenderness, or signs of infection (pain, swelling, redness, odor or green/yellow discharge around incision site)    Complete by:  As directed    Call MD for:  severe uncontrolled pain    Complete by:  As directed    Call MD for:  temperature >100.4    Complete by:  As directed    Diet - low sodium heart healthy    Complete by:  As directed    Increase activity slowly    Complete by:  As directed       Disposition: 01-Home or Self Care  Discharge Medications: Allergies as of 04/09/2017   No Known Allergies     Medication List    TAKE these medications   ALLERGY RELIEF PO Take 1 tablet by mouth daily as needed (for allergies).   Calcium Carbonate-Vitamin D 600-400 MG-UNIT tablet Take 1 tablet by mouth daily.   cyanocobalamin 1000 MCG/ML injection Commonly known as:  (VITAMIN B-12) Inject 1,000 mcg into the muscle every 30 (thirty) days.   lisinopril 20 MG tablet Commonly known as:  PRINIVIL,ZESTRIL Take 20 mg by mouth daily.   metFORMIN 1000 MG tablet Commonly known as:  GLUCOPHAGE Take 1,000 mg by  mouth 2 (two) times daily.   Omega-3 1000 MG Caps Take 1,000 mg by mouth daily.   oxyCODONE 5 MG immediate release tablet Commonly known as:  Oxy IR/ROXICODONE Take 1-2 tablets (5-10 mg total) by mouth every 4 (four) hours as needed for moderate pain.   pravastatin 20 MG tablet Commonly known as:  PRAVACHOL Take 10 mg by mouth at bedtime.   STOOL SOFTENER PO Take 1 capsule by mouth every other day.        Follwup: Follow-up Information    Pabon, [place] F, MD. Go in 5 day(s).   Specialty:  General Surgery Why:  Report to clinic at 3:00pm for a 3:15pm appointment with Dr. Ulyses Amor information: [address] [place] [address] [phone]           Signed: Clayburn Pert 04/09/2017, 12:03 PM

## 2017-04-09 NOTE — Discharge Instructions (Signed)
Surgical Drain Home Care °Surgical drains are used to remove extra fluid that normally builds up in a surgical wound after surgery. A surgical drain helps to heal a surgical wound. Different kinds of surgical drains include: °· Active drains. These drains use suction to pull drainage away from the surgical wound. Drainage flows through a tube to a container outside of the body. It is important to keep the bulb or the drainage container flat (compressed) at all times, except while you empty it. Flattening the bulb or container creates suction. The two most common types of active drains are bulb drains and Hemovac drains. °· Passive drains. These drains allow fluid to drain naturally, by gravity. Drainage flows through a tube to a bandage (dressing) or a container outside of the body. Passive drains do not need to be emptied. The most common type of passive drain is the Penrose drain. ° °A drain is placed during surgery. Immediately after surgery, drainage is usually bright red and a little thicker than water. The drainage may gradually turn yellow or pink and become thinner. It is likely that your health care provider will remove the drain when the drainage stops or when the amount decreases to 1-2 Tbsp (15-30 mL) during a 24-hour period. °How to care for your surgical drain °· Keep the skin around the drain dry and covered with a dressing at all times. °· Check your drain area every day for signs of infection. Check for: °? More redness, swelling, or pain. °? Pus or a bad smell. °? Cloudy drainage. °Follow instructions from your health care provider about how to take care of your drain and how to change your dressing. Change your dressing at least one time every day. Change it more often if needed to keep the dressing dry. Make sure you: °1. Gather your supplies, including: °? Tape. °? Germ-free cleaning solution (sterile saline). °? Split gauze drain sponge: 4 x 4 inches (10 x 10 cm). °? Gauze square: 4 x 4 inches  (10 x 10 cm). °2. Wash your hands with soap and water before you change your dressing. If soap and water are not available, use hand sanitizer. °3. Remove the old dressing. Avoid using scissors to do that. °4. Use sterile saline to clean your skin around the drain. °5. Place the tube through the slit in a drain sponge. Place the drain sponge so that it covers your wound. °6. Place the gauze square or another drain sponge on top of the drain sponge that is on the wound. Make sure the tube is between those layers. °7. Tape the dressing to your skin. °8. If you have an active bulb or Hemovac drain, tape the drainage tube to your skin 1-2 inches (2.5-5 cm) below the place where the tube enters your body. Taping keeps the tube from pulling on any stitches (sutures) that you have. °9. Wash your hands with soap and water. °10. Write down the color of your drainage and how often you change your dressing. ° °How to empty your active bulb or Hemovac drain °1. Make sure that you have a measuring cup that you can empty your drainage into. °2. Wash your hands with soap and water. If soap and water are not available, use hand sanitizer. °3. Gently move your fingers down the tube while squeezing very lightly. This is called stripping the tube. This clears any drainage, clots, or tissue from the tube. °? Do not pull on the tube. °? You may need to strip   the tube several times every day to keep the tube clear. 4. Open the bulb cap or the drain plug. Do not touch the inside of the cap or the bottom of the plug. 5. Empty all of the drainage into the measuring cup. 6. Compress the bulb or the container and replace the cap or the plug. To compress the bulb or the container, squeeze it firmly in the middle while you close the cap or plug the container. 7. Write down the amount of drainage that you have in each 24-hour period. If you have less than 2 Tbsp (30 mL) of drainage during 24 hours, contact your health care  provider. 8. Flush the drainage down the toilet. 9. Wash your hands with soap and water. Contact a health care provider if:  You have more redness, swelling, or pain around your drain area.  The amount of drainage that you have is increasing instead of decreasing.  You have pus or a bad smell coming from your drain area.  You have a fever.  You have drainage that is cloudy.  There is a sudden stop or a sudden decrease in the amount of drainage that you have.  Your tube falls out.  Your active draindoes not stay compressedafter you empty it. This information is not intended to replace advice given to you by your health care provider. Make sure you discuss any questions you have with your health care provider. Document Released: 05/24/2000 Document Revised: 11/02/2015 Document Reviewed: 12/14/2014 Elsevier Interactive Patient Education  2018 [employer].   Endoscopic Retrograde Cholangiopancreatogram, Care After This sheet gives you information about how to care for yourself after your procedure. Your health care provider may also give you more specific instructions. If you have problems or questions, contact your health care provider. What can I expect after the procedure? After the procedure, it is common to have:  Soreness in your throat.  Nausea.  Bloating.  Dizziness.  Tiredness (fatigue).  Follow these instructions at home:  Take over-the-counter and prescription medicines only as told by your health care provider.  Do not drive for 24 hours if you were given a medicine to help you relax (sedative) during your procedure. Have someone stay with you for 24 hours after the procedure.  Return to your normal activities as told by your health care provider. Ask your health care provider what activities are safe for you.  Return to eating what you normally do as soon as you feel well enough or as told by your health care provider.  Keep all follow-up visits as told  by your health care provider. This is important. Contact a health care provider if:  You have pain in your abdomen that does not get better with medicine.  You develop signs of infection, such as: ? Chills. ? Feeling unwell. Get help right away if:  You have difficulty swallowing.  You have worsening pain in your throat, chest, or abdomen.  You vomit bright red blood or a substance that looks like coffee grounds.  You have bloody or very black stools.  You have a fever.  You have a sudden increase in swelling (bloating) in your abdomen. Summary  After the procedure, it is common to feel tired and to have some discomfort in your throat.  Contact your health care provider if you have signs of infection--such as chills or feeling unwell--or if you have pain that does not improve with medicine.  Get help right away if you have trouble swallowing,  worsening pain, bloody or black vomit, bloody or black stools, a fever, or increased swelling in your abdomen.  Keep all follow-up visits as told by your health care provider. This is important. This information is not intended to replace advice given to you by your health care provider. Make sure you discuss any questions you have with your health care provider. Document Released: 03/17/2013 Document Revised: 04/15/2016 Document Reviewed: 04/15/2016 Elsevier Interactive Patient Education  2017 [employer].   Open Cholecystectomy, Care After This sheet gives you information about how to care for yourself after your procedure. Your health care provider may also give you more specific instructions. If you have problems or questions, contact your health care provider. What can I expect after the procedure? After the procedure, it is common to have:  Pain at your incision site. You will be given medicines to control this pain.  Mild nausea or vomiting.  Follow these instructions at home: Incision care   Follow instructions from your  health care provider about how to take care of your incision. Make sure you: ? Wash your hands with soap and water before you change your bandage (dressing). If soap and water are not available, use hand sanitizer. ? Change your dressing as told by your health care provider. ? Leave stitches (sutures), skin glue, or adhesive strips in place. These skin closures may need to be in place for 2 weeks or longer. If adhesive strip edges start to loosen and curl up, you may trim the loose edges. Do not remove adhesive strips completely unless your health care provider tells you to do that.  Do not take baths, swim, or use a hot tub until your health care provider approves. Ask your health care provider if you can take showers. You may only be allowed to take sponge baths for bathing.  Check your incision area every day for signs of infection. Check for: ? More redness, swelling, or pain. ? More fluid or blood. ? Warmth. ? Pus or a bad smell. Activity  Do not drive or use heavy machinery while taking prescription pain medicine.  Do not lift anything that is heavier than 10 lb (4.5 kg) until your health care provider approves.  Do not play contact sports until your health care provider approves.  Do not drive for 24 hours if you were given a medicine to help you relax (sedative).  Rest as needed. Do not return to work or school until your health care provider approves. General instructions  Take over-the-counter and prescription medicines only as told by your health care provider.  To prevent or treat constipation while you are taking prescription pain medicine, your health care provider may recommend that you: ? Drink enough fluid to keep your urine clear or pale yellow. ? Take over-the-counter or prescription medicines. ? Eat foods that are high in fiber, such as fresh fruits and vegetables, whole grains, and beans. ? Limit foods that are high in fat and processed sugars, such as fried and  sweet foods. Contact a health care provider if:  You develop a rash.  You have more redness, swelling, or pain around your incision.  You have more fluid or blood coming from your incision.  Your incision feels warm to the touch.  You have pus or a bad smell coming from your incision.  You have a fever.  Your incision breaks open. Get help right away if:  You have trouble breathing.  You have chest pain.  You have increasing  pain in your shoulders.  You faint or feel dizzy when you stand.  You have severe pain in your abdomen.  You have nausea or vomiting that lasts for more than one day.  You have leg pain. This information is not intended to replace advice given to you by your health care provider. Make sure you discuss any questions you have with your health care provider. Document Released: 09/12/2003 Document Revised: 12/16/2015 Document Reviewed: 11/13/2015 Elsevier Interactive Patient Education  2018 [employer].

## 2017-04-10 ENCOUNTER — Telehealth: Payer: Self-pay

## 2017-04-10 ENCOUNTER — Other Ambulatory Visit: Payer: Self-pay

## 2017-04-10 DIAGNOSIS — R112 Nausea with vomiting, unspecified: Secondary | ICD-10-CM

## 2017-04-10 DIAGNOSIS — Z9889 Other specified postprocedural states: Principal | ICD-10-CM

## 2017-04-10 NOTE — Telephone Encounter (Signed)
Called patient to let him know that Dr. Adonis Huguenin wanted him to have blood work prior to his appointment on 04/14/2017 with Dr. Dahlia Byes. I informed patient that he should go to the [facility] around 2:15-2:30 PM prior to his appointment with Dr. Dahlia Byes at 3:15 PM. Patient and wife, Mardene Celeste understood and had no further questions.

## 2017-04-10 NOTE — Telephone Encounter (Signed)
-----   Message from Reece Packer, RN sent at 04/09/2017  4:56 PM EDT ----- Please order CBC and CMP to be done prior to his appointment on 11/5 with Pabon. Adonis Huguenin is the one that told me to order it. I have not notified patient of this.

## 2017-04-11 ENCOUNTER — Other Ambulatory Visit: Payer: Self-pay

## 2017-04-11 NOTE — Patient Outreach (Signed)
[facility]) Care Management  04/11/2017  Benjamin Austin 04-30-1931 [id number]   Referral Date: 04/11/17  Referral Source:  [employer] Report Date of Admission: 04/02/17 Diagnosis: Bile leak post cholecystectomy Date of Discharge: 04/09/17 Facility: [place]: [place] attempt # 2 Spoke with patient he lives with his wife.  His wife is his primary caregiver.  Patient report he is doing ok except for stomach soreness from his surgery.  Patient reports that he has transportation to appointments.    Conditions: Bile leak post cholecystectomy and type 2 DM.  Medications: Patient has no problems with medications and has no questions.  Appointments: Patient has appointment on Monday with Dr. Dahlia Byes  Consent: RN CM reviewed [facility] services with patient. Patient declined any care management needs  Plan: RN CM will close case as no needs identified. RN CM will send letter and brochure for future reference.  RN CM will notify care management assistant of case status.   [name], RN, MSN [facility] Care Management Care Management Coordinator Direct Line [phone] Toll Free: [phone]  Fax: [phone]

## 2017-04-11 NOTE — Patient Outreach (Signed)
[facility]) Care Management  04/11/2017  MAXWEL MEADOWCROFT 12/09/1930 [id number]   Telephone call to patient for [facility] needs assessment.  No answer.  HIPAA compliant voice message left.  Plan: RN CM will attempt patient again within 3 business days.  [name], RN, MSN [facility] Care Management Care Management Coordinator Direct Line [phone] Toll Free: [phone]  Fax: [phone]

## 2017-04-14 ENCOUNTER — Other Ambulatory Visit
Admission: RE | Admit: 2017-04-14 | Discharge: 2017-04-14 | Disposition: A | Discharge status: Home / Self Care | Payer: Medicare HMO | Source: Ambulatory Visit | Attending: General Surgery | Admitting: General Surgery

## 2017-04-14 ENCOUNTER — Encounter: Payer: Self-pay | Admitting: Surgery

## 2017-04-14 ENCOUNTER — Ambulatory Visit
Admission: RE | Admit: 2017-04-14 | Discharge: 2017-04-14 | Disposition: A | Discharge status: Home / Self Care | Payer: Medicare HMO | Source: Ambulatory Visit | Attending: Surgery | Admitting: Surgery

## 2017-04-14 ENCOUNTER — Ambulatory Visit (INDEPENDENT_AMBULATORY_CARE_PROVIDER_SITE_OTHER): Payer: Medicare HMO | Admitting: Surgery

## 2017-04-14 VITALS — BP 112/80 | HR 80 | Temp 97.9°F

## 2017-04-14 DIAGNOSIS — R112 Nausea with vomiting, unspecified: Secondary | ICD-10-CM | POA: Diagnosis not present

## 2017-04-14 DIAGNOSIS — Z9689 Presence of other specified functional implants: Secondary | ICD-10-CM | POA: Insufficient documentation

## 2017-04-14 DIAGNOSIS — Z4682 Encounter for fitting and adjustment of non-vascular catheter: Secondary | ICD-10-CM | POA: Diagnosis not present

## 2017-04-14 DIAGNOSIS — Z9889 Other specified postprocedural states: Secondary | ICD-10-CM | POA: Diagnosis not present

## 2017-04-14 DIAGNOSIS — Z09 Encounter for follow-up examination after completed treatment for conditions other than malignant neoplasm: Secondary | ICD-10-CM

## 2017-04-14 LAB — CBC WITH DIFFERENTIAL/PLATELET
BASOS PCT: 1 %
Basophils Absolute: 0.1 10*3/uL (ref 0–0.1)
EOS ABS: 0.1 10*3/uL (ref 0–0.7)
EOS PCT: 1 %
HCT: 29.9 % — ABNORMAL LOW (ref 40.0–52.0)
HEMOGLOBIN: 9.7 g/dL — AB (ref 13.0–18.0)
LYMPHS ABS: 1.2 10*3/uL (ref 1.0–3.6)
Lymphocytes Relative: 9 %
MCH: 27.3 pg (ref 26.0–34.0)
MCHC: 32.3 g/dL (ref 32.0–36.0)
MCV: 84.3 fL (ref 80.0–100.0)
MONOS PCT: 6 %
Monocytes Absolute: 0.8 10*3/uL (ref 0.2–1.0)
NEUTROS PCT: 83 %
Neutro Abs: 11 10*3/uL — ABNORMAL HIGH (ref 1.4–6.5)
PLATELETS: 511 10*3/uL — AB (ref 150–440)
RBC: 3.54 MIL/uL — ABNORMAL LOW (ref 4.40–5.90)
RDW: 15.1 % — ABNORMAL HIGH (ref 11.5–14.5)
WBC: 13.2 10*3/uL — AB (ref 3.8–10.6)

## 2017-04-14 LAB — COMPREHENSIVE METABOLIC PANEL
ALK PHOS: 106 U/L (ref 38–126)
ALT: 28 U/L (ref 17–63)
AST: 30 U/L (ref 15–41)
Albumin: 2.8 g/dL — ABNORMAL LOW (ref 3.5–5.0)
Anion gap: 13 (ref 5–15)
BUN: 21 mg/dL — AB (ref 6–20)
CALCIUM: 8.8 mg/dL — AB (ref 8.9–10.3)
CO2: 22 mmol/L (ref 22–32)
CREATININE: 1.15 mg/dL (ref 0.61–1.24)
Chloride: 101 mmol/L (ref 101–111)
GFR, EST NON AFRICAN AMERICAN: 57 mL/min — AB (ref >60)
Glucose, Bld: 217 mg/dL — ABNORMAL HIGH (ref 65–99)
Potassium: 4 mmol/L (ref 3.5–5.1)
Sodium: 136 mmol/L (ref 135–145)
Total Bilirubin: 0.5 mg/dL (ref 0.3–1.2)
Total Protein: 7.7 g/dL (ref 6.5–8.1)

## 2017-04-14 MED ORDER — FERROUS SULFATE 325 (65 FE) MG PO TABS: 325.0000 mg | ORAL_TABLET | Freq: Two times a day (BID) | ORAL | 1 refills | Status: AC

## 2017-04-14 NOTE — Patient Instructions (Signed)
Please continue to lod in your drain output.  Please go to the [place] and have your X-Ray done today.  We will see you back in two weeks to make sure that you are feeling better.  You will see Dr. Allen Norris on 04/17/2017 at 3:00 PM at the [facility] location.

## 2017-04-14 NOTE — Progress Notes (Signed)
S/p open chole complicated by bile leak s/p ERCP Continues to have anywhere from 150- 300cc day Takin PO No fevers or chills Labs w improved hb. Bump in BUN Normal LFTs, mild increase wbc  PE: NAD Abd: soft, NT, incisions healing well, no infection. JP w bile   A/P Bile leak s/p ERCP We will order kub to check positioning of stent RTC 2 weeks F/U w GI to make sure stent is patent repeat labs w next f/u appt Pt to d/w PCP about B12 vit shot PO ferrous sulfate

## 2017-04-14 NOTE — Addendum Note (Signed)
Addended by: Wayna Chalet on: 04/14/2017 03:44 PM   Modules accepted: Orders

## 2017-04-15 ENCOUNTER — Ambulatory Visit: Payer: Self-pay

## 2017-04-15 DIAGNOSIS — E538 Deficiency of other specified B group vitamins: Secondary | ICD-10-CM | POA: Diagnosis not present

## 2017-04-16 ENCOUNTER — Other Ambulatory Visit: Payer: Self-pay

## 2017-04-16 NOTE — Patient Outreach (Signed)
Benjamin Austin) Care Management  04/16/2017  Benjamin Austin 09/26/1930 [id number]   EMMI: General Discharge Referral Date: 04/16/17 Referral Source: EMMI Referral Reason: Lost interest in things? Yes Sad/hopeless/anxious/empty-yes Day # 4   Outreach attempt # 1 Spoke with patient he is able to verify HIPAA. Patient reports he is feeling good and getting better. Reviewed and addressed red alert.  Patient reports that he really does not have those feelings.  He reports yesterday was a rough day for him but he feels much better today.  Discussed with patient feelings of depression and that if the feelings consume him he may need to discuss it with his primary physician.  He verbalized understanding.    Plan: RN CM will close case at this time as no needs and will notify care management assistant of case status.    [name], RN, MSN [facility] Care Management Care Management Coordinator Direct Line [phone] Toll Free: [phone]  Fax: [phone]

## 2017-04-17 ENCOUNTER — Encounter: Payer: Self-pay | Admitting: Gastroenterology

## 2017-04-17 ENCOUNTER — Ambulatory Visit: Payer: Medicare HMO | Admitting: Gastroenterology

## 2017-04-17 VITALS — BP 103/55 | HR 103 | Temp 97.8°F | Ht 68.0 in | Wt 148.0 lb

## 2017-04-17 DIAGNOSIS — K838 Other specified diseases of biliary tract: Secondary | ICD-10-CM

## 2017-04-17 NOTE — Progress Notes (Signed)
Primary Care Physician: Dion Body, MD  Primary Gastroenterologist:  Dr. Lucilla Lame  No chief complaint on file.   HPI: Benjamin Austin is a 83 y.o. male here after seeing the surgeon for follow-up. The patient had a bile duct leak and underwent an ERCP with the placement of 2 stents. The patient's percutaneous drain continues to drain bile. The patient was found to have a large leak of contrast during the ERCP and the source of the extravasation could not be clearly identified. Two stents were placed to try and facilitate drainage. The surgeon sent the patient for a KUB which showed the stents to appear to be in good position.  Current Outpatient Medications  Medication Sig Dispense Refill  . Calcium Carbonate-Vitamin D 600-400 MG-UNIT tablet Take 1 tablet by mouth daily.    . Chlorpheniramine Maleate (ALLERGY RELIEF PO) Take 1 tablet by mouth daily as needed (for allergies).    . cyanocobalamin (,VITAMIN B-12,) 1000 MCG/ML injection Inject 1,000 mcg into the muscle every 30 (thirty) days.     Mariane Baumgarten Calcium (STOOL SOFTENER PO) Take 1 capsule by mouth every other day.    . ferrous sulfate 325 (65 FE) MG tablet Take 1 tablet (325 mg total) 2 (two) times daily with a meal by mouth. 60 tablet 1  . lisinopril (PRINIVIL,ZESTRIL) 20 MG tablet Take 20 mg by mouth daily.    . metFORMIN (GLUCOPHAGE) 1000 MG tablet Take 1,000 mg by mouth 2 (two) times daily.    . Omega-3 1000 MG CAPS Take 1,000 mg by mouth daily.    Marland Kitchen oxyCODONE (OXY IR/ROXICODONE) 5 MG immediate release tablet Take 1-2 tablets (5-10 mg total) by mouth every 4 (four) hours as needed for moderate pain. 30 tablet 0  . pravastatin (PRAVACHOL) 20 MG tablet Take 10 mg by mouth at bedtime.     No current facility-administered medications for this visit.     Allergies as of 04/17/2017  . (No Known Allergies)    ROS:  General: Negative for anorexia, weight loss, fever, chills, fatigue, weakness. ENT: Negative for  hoarseness, difficulty swallowing , nasal congestion. CV: Negative for chest pain, angina, palpitations, dyspnea on exertion, peripheral edema.  Respiratory: Negative for dyspnea at rest, dyspnea on exertion, cough, sputum, wheezing.  GI: See history of present illness. GU:  Negative for dysuria, hematuria, urinary incontinence, urinary frequency, nocturnal urination.  Endo: Negative for unusual weight change.    Physical Examination:   There were no vitals taken for this visit.  General: Well-nourished, well-developed in no acute distress.  Eyes: No icterus. Conjunctivae pink. Mouth: Oropharyngeal mucosa moist and pink , no lesions erythema or exudate. Lungs: Clear to auscultation bilaterally. Non-labored. Heart: Regular rate and rhythm, no murmurs rubs or gallops.  Abdomen: Bowel sounds are normal, nontender, nondistended, no hepatosplenomegaly or masses, no abdominal bruits or hernia , no rebound or guarding.   Extremities: No lower extremity edema. No clubbing or deformities. Neuro: Alert and oriented x 3.  Grossly intact. Skin: Warm and dry, no jaundice.   Psych: Alert and cooperative, normal mood and affect.  Labs:    Imaging Studies: Dg Abd 1 View  Result Date: 04/02/2017 CLINICAL DATA:  Pt in surgery and they wanted to check for foreign body or any retained objects from surgery. EXAM: ABDOMEN - 1 VIEW COMPARISON:  None. FINDINGS: There is a nasogastric tube with the tip projecting over the stomach. The bowel gas pattern is normal. There is a right upper quadrant  drain. There are surgical staples in the right upper quadrant from cholecystectomy. There is a small amount of contrast in the small bowel from intraoperative cholangiogram. No radio-opaque calculi or other significant radiographic abnormality are seen. There is no radiopaque foreign body. IMPRESSION: No radiopaque foreign body within the abdomen. Electronically Signed   By: Kathreen Devoid   On: 04/02/2017 15:08   Dg  Cholangiogram Operative  Result Date: 04/02/2017 CLINICAL DATA:  History of acute calculus cholecystitis, post ultrasound fluoroscopic guided cholecystostomy tube placement on 01/18/2017. Intraoperative cholangiogram during laparoscopic cholecystectomy. EXAM: INTRAOPERATIVE CHOLANGIOGRAM FLUOROSCOPY TIME:  18 seconds COMPARISON:  Cholangiogram via existing cholecystostomy tube - 02/11/2017 FINDINGS: Intraoperative cholangiographic images of the right upper abdominal quadrant during laparoscopic cholecystectomy are provided for review. Contrast has been injected via the patient's existing percutaneous cholecystostomy tube. There several nonocclusive filling defects within the gallbladder compatible with gallstones and/or biliary sludge. Contrast injection demonstrates selective cannulation of the central aspect of the cystic duct. There is passage of contrast through the central aspect of the cystic duct with filling of a non dilated common bile duct. There is passage of contrast though the CBD and into the descending portion of the duodenum. There is minimal reflux of injected contrast into the common hepatic duct and central aspect of the non dilated intrahepatic biliary system. There is a persistent lentiform appearing nonocclusive filling defect within the distal aspect of the CBD again worrisome for choledocholithiasis, similar to cholangiogram performed 02/11/2017. IMPRESSION: Grossly unchanged suspected nonocclusive choledocholithiasis within the distal aspect of the CBD, similar to the 02/11/2017 examination. Correlation with the operative report is recommended. Further evaluation with ERCP could be performed as indicated. Electronically Signed   By: Sandi Mariscal M.D.   On: 04/02/2017 12:49   Nm Hepatobiliary Liver Func  Result Date: 04/05/2017 CLINICAL DATA:  Status post cholecystectomy 3 days ago. Worsening abdominal pain. Evaluate for postop bile leak. EXAM: NUCLEAR MEDICINE HEPATOBILIARY IMAGING  TECHNIQUE: Sequential images of the abdomen were obtained out to 60 minutes following intravenous administration of radiopharmaceutical. RADIOPHARMACEUTICALS:  5.4 mCi Tc-48m  Choletec IV COMPARISON:  None. FINDINGS: Prompt uptake and biliary excretion of activity by the liver is seen. No gallbladder activity seen, consistent with a prior cholecystectomy. Biliary activity passes promptly into small bowel, consistent with patent common bile duct. No extravasation of radiopharmaceutical activity identified. IMPRESSION: Normal study status post cholecystectomy. No evidence of postop bile leak or biliary obstruction. Electronically Signed   By: Earle Gell M.D.   On: 04/05/2017 14:38   Dg Abd 2 Views  Result Date: 04/15/2017 CLINICAL DATA:  Follow-up stent placement EXAM: ABDOMEN - 2 VIEW COMPARISON:  04/02/2017 FINDINGS: Two common bile duct stents are noted in apparent satisfactory position. Pneumobilia is noted consistent with the stent placement. Surgical drain is noted in the right upper quadrant. Scattered large and small bowel gas is noted. No free air is seen. No acute bony abnormality is noted. Postsurgical changes in the lower pelvis are noted as well on the left stable from the prior exam. IMPRESSION: Stents appear in satisfactory position although no prior films are available for comparison. A surgical drain in place. Electronically Signed   By: Inez Catalina M.D.   On: 04/15/2017 09:32   Dg C-arm 1-60 Min-no Report  Result Date: 04/07/2017 Fluoroscopy was utilized by the requesting physician.  No radiographic interpretation.    Assessment and Plan:   Benjamin Austin is a 83 y.o. y/o male who is status post ERCP for bile  duct leak. The bile duct leak during the ERCP showed extensive extravasation of the bile in the common hepatic duct. Two stents were placed across what appeared to be the area of the leak. Further stenting this patient will not be beneficial at this time.  I have spoken to  surgery and they will see him next week to set the patient up to be followed at a tertiary care center for possible repair of the bile duct injury.  The patient and his wife have been explained the plan and agree with it.    Lucilla Lame, MD. Marval Regal   Note: This dictation was prepared with Dragon dictation along with smaller phrase technology. Any transcriptional errors that result from this process are unintentional.

## 2017-04-18 ENCOUNTER — Telehealth: Payer: Self-pay

## 2017-04-18 DIAGNOSIS — R1084 Generalized abdominal pain: Secondary | ICD-10-CM

## 2017-04-18 MED ORDER — AMOXICILLIN-POT CLAVULANATE 875-125 MG PO TABS: 1.0000 | ORAL_TABLET | Freq: Two times a day (BID) | ORAL | 0 refills | Status: AC

## 2017-04-18 NOTE — Telephone Encounter (Addendum)
Called and spoke with patient's wife, Mardene Celeste. I asked mrs. Spurgeon how her husband was feeling. She stated that he was not feeling well and weak. I then told her that Dr. Dahlia Byes wanted for him to be seen by Dr. Rosana Hoes after he had his CT Scan

## 2017-04-18 NOTE — Telephone Encounter (Signed)
Called Benjamin Austin to let her know that Dr. Dahlia Byes wants her husband to have a CT Scan, labs and to be seen by Dr. Rosana Hoes on Monday. I also told her that I would send a prescription (antibiotics) to his pharmacy so he could start them today.  I also told Benjamin Austin that Dr. Dahlia Byes wanted Benjamin Austin to be seen by the Hepatobiliary surgeon at [facility]. She stated that she received a call from them and that they will see Benjamin Austin on 04/25/2017 by Dr. Cloyd Stagers. I told her that it was correct.  Benjamin Austin understood and had no questions.

## 2017-04-21 ENCOUNTER — Encounter: Payer: Self-pay | Admitting: Surgery

## 2017-04-21 ENCOUNTER — Telehealth: Payer: Self-pay | Admitting: General Practice

## 2017-04-21 ENCOUNTER — Ambulatory Visit (INDEPENDENT_AMBULATORY_CARE_PROVIDER_SITE_OTHER): Payer: Medicare HMO | Admitting: Surgery

## 2017-04-21 ENCOUNTER — Other Ambulatory Visit
Admission: RE | Admit: 2017-04-21 | Discharge: 2017-04-21 | Disposition: A | Discharge status: Home / Self Care | Payer: Medicare HMO | Source: Ambulatory Visit | Attending: Surgery | Admitting: Surgery

## 2017-04-21 ENCOUNTER — Ambulatory Visit
Admission: RE | Admit: 2017-04-21 | Discharge: 2017-04-21 | Disposition: A | Discharge status: Home / Self Care | Payer: Medicare HMO | Source: Ambulatory Visit | Attending: Surgery | Admitting: Surgery

## 2017-04-21 ENCOUNTER — Telehealth: Payer: Self-pay | Admitting: Emergency Medicine

## 2017-04-21 ENCOUNTER — Other Ambulatory Visit: Payer: Self-pay

## 2017-04-21 VITALS — BP 157/84 | HR 102 | Temp 97.8°F | Ht 68.0 in | Wt 145.4 lb

## 2017-04-21 DIAGNOSIS — R918 Other nonspecific abnormal finding of lung field: Secondary | ICD-10-CM | POA: Diagnosis not present

## 2017-04-21 DIAGNOSIS — R1084 Generalized abdominal pain: Secondary | ICD-10-CM | POA: Diagnosis not present

## 2017-04-21 DIAGNOSIS — Z9049 Acquired absence of other specified parts of digestive tract: Secondary | ICD-10-CM | POA: Insufficient documentation

## 2017-04-21 DIAGNOSIS — K838 Other specified diseases of biliary tract: Secondary | ICD-10-CM

## 2017-04-21 DIAGNOSIS — R109 Unspecified abdominal pain: Secondary | ICD-10-CM | POA: Diagnosis not present

## 2017-04-21 LAB — CBC WITH DIFFERENTIAL/PLATELET
BASOS PCT: 1 %
Basophils Absolute: 0.3 10*3/uL — ABNORMAL HIGH (ref 0–0.1)
Eosinophils Absolute: 0.1 10*3/uL (ref 0–0.7)
Eosinophils Relative: 1 %
HEMATOCRIT: 30.6 % — AB (ref 40.0–52.0)
HEMOGLOBIN: 10 g/dL — AB (ref 13.0–18.0)
LYMPHS ABS: 1.5 10*3/uL (ref 1.0–3.6)
Lymphocytes Relative: 8 %
MCH: 27.2 pg (ref 26.0–34.0)
MCHC: 32.6 g/dL (ref 32.0–36.0)
MCV: 83.3 fL (ref 80.0–100.0)
MONOS PCT: 9 %
Monocytes Absolute: 1.7 10*3/uL — ABNORMAL HIGH (ref 0.2–1.0)
NEUTROS ABS: 15.2 10*3/uL — AB (ref 1.4–6.5)
NEUTROS PCT: 81 %
Platelets: 520 10*3/uL — ABNORMAL HIGH (ref 150–440)
RBC: 3.67 MIL/uL — AB (ref 4.40–5.90)
RDW: 15.5 % — ABNORMAL HIGH (ref 11.5–14.5)
WBC: 18.8 10*3/uL — ABNORMAL HIGH (ref 3.8–10.6)

## 2017-04-21 LAB — COMPREHENSIVE METABOLIC PANEL
ALBUMIN: 3 g/dL — AB (ref 3.5–5.0)
ALK PHOS: 252 U/L — AB (ref 38–126)
ALT: 43 U/L (ref 17–63)
AST: 49 U/L — ABNORMAL HIGH (ref 15–41)
Anion gap: 15 (ref 5–15)
BILIRUBIN TOTAL: 0.5 mg/dL (ref 0.3–1.2)
BUN: 19 mg/dL (ref 6–20)
CALCIUM: 9.3 mg/dL (ref 8.9–10.3)
CO2: 22 mmol/L (ref 22–32)
CREATININE: 1.07 mg/dL (ref 0.61–1.24)
Chloride: 100 mmol/L — ABNORMAL LOW (ref 101–111)
GFR calc Af Amer: >60 mL/min (ref >60)
GFR calc non Af Amer: >60 mL/min (ref >60)
GLUCOSE: 162 mg/dL — AB (ref 65–99)
Potassium: 3.9 mmol/L (ref 3.5–5.1)
Sodium: 137 mmol/L (ref 135–145)
TOTAL PROTEIN: 8.4 g/dL — AB (ref 6.5–8.1)

## 2017-04-21 MED ORDER — IOPAMIDOL (ISOVUE-300) INJECTION 61%
100.0000 mL | Freq: Once | INTRAVENOUS | Status: AC | PRN
  Administered 2017-04-21: 100 mL via INTRAVENOUS

## 2017-04-21 NOTE — Telephone Encounter (Signed)
Ebony Hail called from cat scan they have a report on Mr Brendle.please call her back at [phone].

## 2017-04-21 NOTE — Progress Notes (Signed)
Surgical Clinic Progress/Follow-up Note   HPI:  83 y.o. Male presented to clinic today for follow-up evaluation s/p laparoscopic converted to open cholecystectomy, complicated by large bile leak controlled with intra-operatively placed drain, s/p ERCP with "extensive extravasation of the bile from the common hepatic duct" and placement of 2 biliary stents in "an effort to facilitate drainage", though the exact source of extravasation was "unable to be clearly identified".   Patient and his wife report 29 - 80 mL of bilious drainage 3x daily, though he continues to eat a normal diet and to drink water and Gatorade while producing normal amounts of pale yellow urine. He continues to sleep both day and night as he was doing pre-operatively, but he has been less active, which he attributes to peri-drain site pain for which he has not been taking anything. He describes loose BM's, but denies any profuse, watery, or unusually foul-smelling diarrhea and he continues to take antibiotics as prescribed. Lastly, he has an appointment with [facility] hepatobiliary surgery this Friday and completed labs and CT abdomen/pelvis this morning.   Review of Systems:  Constitutional: denies any other weight loss, fever, chills, or sweats  Eyes: denies any other vision changes, history of eye injury  ENT: denies sore throat, hearing problems  Respiratory: denies shortness of breath, wheezing  Cardiovascular: denies chest pain, palpitations  Gastrointestinal: abdominal pain, N/V, and bowel function as per HPI Musculoskeletal: denies any other joint pains or cramps  Skin: Denies any other rashes or skin discolorations  Neurological: lightheadedness when standing from sitting, baseline generalized weakness, denies headaches Psychiatric: denies any other depression, anxiety  All other review of systems: otherwise negative   Vital Signs:  BP (!) 157/84   Pulse (!) 102   Temp 97.8 F (36.6 C) (Oral)   Ht 5\' 8"  (1.727 m)    Wt 145 lb 6.4 oz (66 kg)   BMI 22.11 kg/m    Physical Exam:  Constitutional:  -- Normal body habitus  -- Awake, alert, and oriented x3  Eyes:  -- Pupils equally round and reactive to light  -- No scleral icterus  Ear, nose, throat:  -- No jugular venous distension  -- No nasal drainage, bleeding Pulmonary:  -- No crackles -- Equal breath sounds bilaterally -- Breathing non-labored at rest Cardiovascular:  -- S1, S2 present  -- No pericardial rubs  Gastrointestinal:  -- Soft and non-distended with mild peri-drain site tenderness to palpation, no guarding/rebound  -- Midline laparotomy incision well-approximated with steri-strips intact, no peri-incisional erythema or drainage -- No abdominal masses appreciated, pulsatile or otherwise  Musculoskeletal / Integumentary:  -- Wounds or skin discoloration: None appreciated except post-surgical abdominal wounds as described above (GI)  -- Extremities: B/L UE and LE FROM, hands and feet warm, no edema  Neurologic:  -- Motor function: intact and symmetric, though resting tremor (unchanged since evaluated while inpatient) -- Sensation: intact and symmetric   Laboratory studies:  CBC Latest Ref Rng & Units 04/21/2017 04/14/2017 04/08/2017  WBC 3.8 - 10.6 K/uL 18.8(H) 13.2(H) 10.1  Hemoglobin 13.0 - 18.0 g/dL 10.0(L) 9.7(L) 8.8(L)  Hematocrit 40.0 - 52.0 % 30.6(L) 29.9(L) 26.7(L)  Platelets 150 - 440 K/uL 520(H) 511(H) 199   CMP Latest Ref Rng & Units 04/21/2017 04/14/2017 04/08/2017  Glucose 65 - 99 mg/dL 162(H) 217(H) 129(H)  BUN 6 - 20 mg/dL 19 21(H) 14  Creatinine 0.61 - 1.24 mg/dL 1.07 1.15 1.08  Sodium 135 - 145 mmol/L 137 136 138  Potassium 3.5 - 5.1  mmol/L 3.9 4.0 3.1(L)  Chloride 101 - 111 mmol/L 100(L) 101 105  CO2 22 - 32 mmol/L 22 22 25   Calcium 8.9 - 10.3 mg/dL 9.3 8.8(L) 8.1(L)  Total Protein 6.5 - 8.1 g/dL 8.4(H) 7.7 6.7  Total Bilirubin 0.3 - 1.2 mg/dL 0.5 0.5 0.9  Alkaline Phos 38 - 126 U/L 252(H) 106 83  AST 15  - 41 U/L 49(H) 30 16  ALT 17 - 63 U/L 43 28 23   Imaging:  CT Abdomen and Pelvis with Contrast (04/21/2017) - personally evaluated and discussed with patient and his wife Status post cholecystectomy. Surgical drain is noted inferior right hepatic lobe. Tip is in expected position of gallbladder fossa. There is interval development of 8.1 x 4.7 cm fluid collection in the gallbladder fossa concerning for biloma or abscess. It contains a small amount of air. Stent is noted in  the common bile duct.  Assessment:  83 y.o. yo Male with a problem list including...  Patient Active Problem List   Diagnosis Date Noted  . Disease of biliary tract   . Bile duct leak   . S/P cholecystectomy 04/02/2017  . Cholecystitis, chronic   . Calculus of gallbladder without cholecystitis without obstruction   . Hepatic lesion   . B12 deficiency 01/28/2017  . GERD without esophagitis 01/28/2017  . Mixed dyslipidemia 01/28/2017  . Type 2 diabetes mellitus without complication, without long-term current use of insulin ([place]) 01/28/2017  . Acute cholecystitis   . RUQ pain 01/17/2017  . Benign essential tremor 03/07/2016  . History of normocytic normochromic anemia 01/02/2016  . Vaccine counseling 01/02/2016    presents to clinic for follow-up evaluation of bile leak s/p laparoscopic converted to open cholecystectomy with intra-operative placement of peri-hepatic drain at gallbladder fossa, now also s/p ERCP with "extensive extravasation of the bile from the common hepatic duct" and placement of 2 biliary stents in "an effort to facilitate drainage", though the exact source of extravasation was "unable to be clearly identified", complicated by pertinent comorbidities including DM, HTN, hypercholesterolemia, osteoarthritis, and former tobacco abuse (cigars).  Plan:   - continue hydration and nutrition, monitor UO, add PO supplements  - NSAIDs or Tylenol prn for mild - moderate peri-incisional/drain site pain  -  attempt to increase ambulation and light activity, first sitting from laying, then standing from sitting before ambulating  - continue antibiotics as prescribed and attend scheduled hepatobiliary surgery appointment at [facility] this Friday  - return to clinic as needed, instructed to call office if any questions or concerns  - bring CD/DVD of today's CT and recent ERCP to Friday's appointment  All of the above recommendations were discussed with the patient and patient's wife, and all of patient's and family's questions were answered to their expressed satisfaction.  -- Marilynne Drivers Rosana Hoes, MD, [place]: [facility] - Partnering for exceptional care. Office: [phone]

## 2017-04-21 NOTE — Telephone Encounter (Signed)
error 

## 2017-04-21 NOTE — Patient Instructions (Signed)
Please continue to eat and drink plenty of fluids daily. We would like for you to try Ensure daily as well, you can add ice cream as this helps with the taste.  You may take Aleve for the discomfort/pain . Please stop by radiology when leaving to pick up the Disc with the CT images on it.   Please call our office if you have any questions or concerns.

## 2017-04-23 ENCOUNTER — Telehealth: Payer: Self-pay | Admitting: General Practice

## 2017-04-23 NOTE — Telephone Encounter (Signed)
Patients wife is calling said patients drain isn't draining anymore. Please call patients wife and advice.

## 2017-04-23 NOTE — Telephone Encounter (Signed)
Spoke with patient at this time.  She is concerned that there has not been any drain out put since being seen in the office a few days ago.  Patient at this time is not feeling any worse than what he felt a few days ago. She denies any fever. He is eating and drinking.   I spoke with Dr.Davis. Per Dr.Davis if patient begins to feel worse overnight to be seen in the emergency room. If patient feels worse tomorrow then call and we can add him to schedule, otherwise keep the appointment with the Hepatobiliary clinic on Friday as scheduled.

## 2017-04-25 DIAGNOSIS — Z9689 Presence of other specified functional implants: Secondary | ICD-10-CM | POA: Diagnosis not present

## 2017-04-25 DIAGNOSIS — Z9049 Acquired absence of other specified parts of digestive tract: Secondary | ICD-10-CM | POA: Diagnosis not present

## 2017-04-25 DIAGNOSIS — R630 Anorexia: Secondary | ICD-10-CM | POA: Diagnosis not present

## 2017-04-25 DIAGNOSIS — R918 Other nonspecific abnormal finding of lung field: Secondary | ICD-10-CM | POA: Diagnosis not present

## 2017-04-25 DIAGNOSIS — R531 Weakness: Secondary | ICD-10-CM | POA: Diagnosis not present

## 2017-04-25 DIAGNOSIS — D72829 Elevated white blood cell count, unspecified: Secondary | ICD-10-CM | POA: Diagnosis not present

## 2017-04-25 DIAGNOSIS — E119 Type 2 diabetes mellitus without complications: Secondary | ICD-10-CM | POA: Diagnosis not present

## 2017-04-25 DIAGNOSIS — I10 Essential (primary) hypertension: Secondary | ICD-10-CM | POA: Diagnosis not present

## 2017-04-25 DIAGNOSIS — K831 Obstruction of bile duct: Secondary | ICD-10-CM | POA: Diagnosis not present

## 2017-04-25 DIAGNOSIS — K839 Disease of biliary tract, unspecified: Secondary | ICD-10-CM | POA: Diagnosis not present

## 2017-04-28 ENCOUNTER — Encounter: Payer: Self-pay | Admitting: Surgery

## 2017-04-29 DIAGNOSIS — Z48815 Encounter for surgical aftercare following surgery on the digestive system: Secondary | ICD-10-CM | POA: Diagnosis not present

## 2017-04-29 DIAGNOSIS — E119 Type 2 diabetes mellitus without complications: Secondary | ICD-10-CM | POA: Diagnosis not present

## 2017-04-29 DIAGNOSIS — Z7984 Long term (current) use of oral hypoglycemic drugs: Secondary | ICD-10-CM | POA: Diagnosis not present

## 2017-04-29 DIAGNOSIS — K838 Other specified diseases of biliary tract: Secondary | ICD-10-CM | POA: Diagnosis not present

## 2017-04-29 DIAGNOSIS — I10 Essential (primary) hypertension: Secondary | ICD-10-CM | POA: Diagnosis not present

## 2017-04-29 DIAGNOSIS — Z4803 Encounter for change or removal of drains: Secondary | ICD-10-CM | POA: Diagnosis not present

## 2017-05-01 DIAGNOSIS — Z48815 Encounter for surgical aftercare following surgery on the digestive system: Secondary | ICD-10-CM | POA: Diagnosis not present

## 2017-05-01 DIAGNOSIS — E119 Type 2 diabetes mellitus without complications: Secondary | ICD-10-CM | POA: Diagnosis not present

## 2017-05-01 DIAGNOSIS — I10 Essential (primary) hypertension: Secondary | ICD-10-CM | POA: Diagnosis not present

## 2017-05-01 DIAGNOSIS — Z4803 Encounter for change or removal of drains: Secondary | ICD-10-CM | POA: Diagnosis not present

## 2017-05-01 DIAGNOSIS — K838 Other specified diseases of biliary tract: Secondary | ICD-10-CM | POA: Diagnosis not present

## 2017-05-01 DIAGNOSIS — Z7984 Long term (current) use of oral hypoglycemic drugs: Secondary | ICD-10-CM | POA: Diagnosis not present

## 2017-05-05 DIAGNOSIS — I10 Essential (primary) hypertension: Secondary | ICD-10-CM | POA: Diagnosis not present

## 2017-05-05 DIAGNOSIS — K819 Cholecystitis, unspecified: Secondary | ICD-10-CM | POA: Diagnosis not present

## 2017-05-05 DIAGNOSIS — E119 Type 2 diabetes mellitus without complications: Secondary | ICD-10-CM | POA: Diagnosis not present

## 2017-05-05 DIAGNOSIS — Z09 Encounter for follow-up examination after completed treatment for conditions other than malignant neoplasm: Secondary | ICD-10-CM | POA: Diagnosis not present

## 2017-05-05 DIAGNOSIS — Z9049 Acquired absence of other specified parts of digestive tract: Secondary | ICD-10-CM | POA: Diagnosis not present

## 2017-05-06 DIAGNOSIS — E119 Type 2 diabetes mellitus without complications: Secondary | ICD-10-CM | POA: Diagnosis not present

## 2017-05-06 DIAGNOSIS — K838 Other specified diseases of biliary tract: Secondary | ICD-10-CM | POA: Diagnosis not present

## 2017-05-06 DIAGNOSIS — I10 Essential (primary) hypertension: Secondary | ICD-10-CM | POA: Diagnosis not present

## 2017-05-06 DIAGNOSIS — Z48815 Encounter for surgical aftercare following surgery on the digestive system: Secondary | ICD-10-CM | POA: Diagnosis not present

## 2017-05-06 DIAGNOSIS — E782 Mixed hyperlipidemia: Secondary | ICD-10-CM | POA: Diagnosis not present

## 2017-05-06 DIAGNOSIS — E538 Deficiency of other specified B group vitamins: Secondary | ICD-10-CM | POA: Diagnosis not present

## 2017-05-06 DIAGNOSIS — Z4803 Encounter for change or removal of drains: Secondary | ICD-10-CM | POA: Diagnosis not present

## 2017-05-06 DIAGNOSIS — Z7984 Long term (current) use of oral hypoglycemic drugs: Secondary | ICD-10-CM | POA: Diagnosis not present

## 2017-05-08 DIAGNOSIS — I10 Essential (primary) hypertension: Secondary | ICD-10-CM | POA: Diagnosis not present

## 2017-05-08 DIAGNOSIS — E119 Type 2 diabetes mellitus without complications: Secondary | ICD-10-CM | POA: Diagnosis not present

## 2017-05-08 DIAGNOSIS — Z48815 Encounter for surgical aftercare following surgery on the digestive system: Secondary | ICD-10-CM | POA: Diagnosis not present

## 2017-05-08 DIAGNOSIS — K838 Other specified diseases of biliary tract: Secondary | ICD-10-CM | POA: Diagnosis not present

## 2017-05-08 DIAGNOSIS — Z7984 Long term (current) use of oral hypoglycemic drugs: Secondary | ICD-10-CM | POA: Diagnosis not present

## 2017-05-08 DIAGNOSIS — Z4803 Encounter for change or removal of drains: Secondary | ICD-10-CM | POA: Diagnosis not present

## 2017-05-09 ENCOUNTER — Encounter: Payer: Self-pay | Admitting: Surgery

## 2017-05-09 ENCOUNTER — Ambulatory Visit (INDEPENDENT_AMBULATORY_CARE_PROVIDER_SITE_OTHER): Payer: Medicare HMO | Admitting: Surgery

## 2017-05-09 DIAGNOSIS — Z09 Encounter for follow-up examination after completed treatment for conditions other than malignant neoplasm: Secondary | ICD-10-CM

## 2017-05-09 NOTE — Patient Instructions (Signed)
We have sent a referral over to Dr. Dorothey Baseman office. If you do not hear from them by Wednesday 12/5 please give our office a call.   If you have any questions or concerns please give our office a call.

## 2017-05-09 NOTE — Progress Notes (Signed)
S/p chole complicated by bile leak. S/P ERCP and stent HPB consultation done. Bile leak resolved JP is out HE feels much better Taking PO, no fever or cholangitis  PE NAD Abd: soft, incision healed, no infection,  A/p Doing well F/U w Dr. Allen Norris in 2-3 weeks He will need his stent remove at some point No surgical intervention

## 2017-05-13 DIAGNOSIS — Z0000 Encounter for general adult medical examination without abnormal findings: Secondary | ICD-10-CM | POA: Diagnosis not present

## 2017-05-13 DIAGNOSIS — E119 Type 2 diabetes mellitus without complications: Secondary | ICD-10-CM | POA: Diagnosis not present

## 2017-05-13 DIAGNOSIS — E782 Mixed hyperlipidemia: Secondary | ICD-10-CM | POA: Diagnosis not present

## 2017-05-14 DIAGNOSIS — Z7984 Long term (current) use of oral hypoglycemic drugs: Secondary | ICD-10-CM | POA: Diagnosis not present

## 2017-05-14 DIAGNOSIS — K838 Other specified diseases of biliary tract: Secondary | ICD-10-CM | POA: Diagnosis not present

## 2017-05-14 DIAGNOSIS — Z4803 Encounter for change or removal of drains: Secondary | ICD-10-CM | POA: Diagnosis not present

## 2017-05-14 DIAGNOSIS — E119 Type 2 diabetes mellitus without complications: Secondary | ICD-10-CM | POA: Diagnosis not present

## 2017-05-14 DIAGNOSIS — Z48815 Encounter for surgical aftercare following surgery on the digestive system: Secondary | ICD-10-CM | POA: Diagnosis not present

## 2017-05-14 DIAGNOSIS — I10 Essential (primary) hypertension: Secondary | ICD-10-CM | POA: Diagnosis not present

## 2017-05-15 ENCOUNTER — Other Ambulatory Visit: Payer: Self-pay

## 2017-05-15 DIAGNOSIS — Z4659 Encounter for fitting and adjustment of other gastrointestinal appliance and device: Secondary | ICD-10-CM

## 2017-05-16 DIAGNOSIS — E538 Deficiency of other specified B group vitamins: Secondary | ICD-10-CM | POA: Diagnosis not present

## 2017-05-21 DIAGNOSIS — E119 Type 2 diabetes mellitus without complications: Secondary | ICD-10-CM | POA: Diagnosis not present

## 2017-05-21 DIAGNOSIS — Z7984 Long term (current) use of oral hypoglycemic drugs: Secondary | ICD-10-CM | POA: Diagnosis not present

## 2017-05-21 DIAGNOSIS — Z4803 Encounter for change or removal of drains: Secondary | ICD-10-CM | POA: Diagnosis not present

## 2017-05-21 DIAGNOSIS — K838 Other specified diseases of biliary tract: Secondary | ICD-10-CM | POA: Diagnosis not present

## 2017-05-21 DIAGNOSIS — Z48815 Encounter for surgical aftercare following surgery on the digestive system: Secondary | ICD-10-CM | POA: Diagnosis not present

## 2017-05-21 DIAGNOSIS — I10 Essential (primary) hypertension: Secondary | ICD-10-CM | POA: Diagnosis not present

## 2017-05-26 ENCOUNTER — Telehealth: Payer: Self-pay | Admitting: Gastroenterology

## 2017-05-26 NOTE — Telephone Encounter (Signed)
Patient's wife called and wants to r/s procedure for December.

## 2017-05-26 NOTE — Telephone Encounter (Signed)
Rescheduled pt's procedure to Dec 28th due to insurance. Contact [facility].

## 2017-05-28 DIAGNOSIS — Z7984 Long term (current) use of oral hypoglycemic drugs: Secondary | ICD-10-CM | POA: Diagnosis not present

## 2017-05-28 DIAGNOSIS — I10 Essential (primary) hypertension: Secondary | ICD-10-CM | POA: Diagnosis not present

## 2017-05-28 DIAGNOSIS — Z48815 Encounter for surgical aftercare following surgery on the digestive system: Secondary | ICD-10-CM | POA: Diagnosis not present

## 2017-05-28 DIAGNOSIS — K838 Other specified diseases of biliary tract: Secondary | ICD-10-CM | POA: Diagnosis not present

## 2017-05-28 DIAGNOSIS — E119 Type 2 diabetes mellitus without complications: Secondary | ICD-10-CM | POA: Diagnosis not present

## 2017-05-28 DIAGNOSIS — Z4803 Encounter for change or removal of drains: Secondary | ICD-10-CM | POA: Diagnosis not present

## 2017-06-06 ENCOUNTER — Ambulatory Visit: Payer: Medicare HMO | Admitting: Anesthesiology

## 2017-06-06 ENCOUNTER — Ambulatory Visit
Admission: RE | Admit: 2017-06-06 | Discharge: 2017-06-06 | Disposition: A | Discharge status: Home / Self Care | Payer: Medicare HMO | Source: Ambulatory Visit | Attending: Gastroenterology | Admitting: Gastroenterology

## 2017-06-06 ENCOUNTER — Ambulatory Visit: Payer: Medicare HMO

## 2017-06-06 ENCOUNTER — Encounter
Admission: RE | Disposition: A | Discharge status: Home / Self Care | Payer: Self-pay | Source: Ambulatory Visit | Attending: Gastroenterology

## 2017-06-06 DIAGNOSIS — E7800 Pure hypercholesterolemia, unspecified: Secondary | ICD-10-CM | POA: Diagnosis not present

## 2017-06-06 DIAGNOSIS — M1990 Unspecified osteoarthritis, unspecified site: Secondary | ICD-10-CM | POA: Insufficient documentation

## 2017-06-06 DIAGNOSIS — E119 Type 2 diabetes mellitus without complications: Secondary | ICD-10-CM | POA: Diagnosis not present

## 2017-06-06 DIAGNOSIS — I252 Old myocardial infarction: Secondary | ICD-10-CM | POA: Insufficient documentation

## 2017-06-06 DIAGNOSIS — E782 Mixed hyperlipidemia: Secondary | ICD-10-CM | POA: Diagnosis not present

## 2017-06-06 DIAGNOSIS — Z79899 Other long term (current) drug therapy: Secondary | ICD-10-CM | POA: Diagnosis not present

## 2017-06-06 DIAGNOSIS — Z7984 Long term (current) use of oral hypoglycemic drugs: Secondary | ICD-10-CM | POA: Diagnosis not present

## 2017-06-06 DIAGNOSIS — R251 Tremor, unspecified: Secondary | ICD-10-CM | POA: Diagnosis not present

## 2017-06-06 DIAGNOSIS — K838 Other specified diseases of biliary tract: Secondary | ICD-10-CM

## 2017-06-06 DIAGNOSIS — Z4659 Encounter for fitting and adjustment of other gastrointestinal appliance and device: Secondary | ICD-10-CM | POA: Diagnosis not present

## 2017-06-06 DIAGNOSIS — Z8249 Family history of ischemic heart disease and other diseases of the circulatory system: Secondary | ICD-10-CM | POA: Insufficient documentation

## 2017-06-06 DIAGNOSIS — Z87891 Personal history of nicotine dependence: Secondary | ICD-10-CM | POA: Insufficient documentation

## 2017-06-06 DIAGNOSIS — I10 Essential (primary) hypertension: Secondary | ICD-10-CM | POA: Insufficient documentation

## 2017-06-06 HISTORY — PX: ERCP: SHX5425

## 2017-06-06 LAB — GLUCOSE, CAPILLARY: GLUCOSE-CAPILLARY: 99 mg/dL (ref 65–99)

## 2017-06-06 SURGERY — ERCP, WITH INTERVENTION IF INDICATED
Anesthesia: General

## 2017-06-06 MED ORDER — LIDOCAINE HCL (CARDIAC) 20 MG/ML IV SOLN
INTRAVENOUS | Status: DC | PRN
  Administered 2017-06-06: 60 mg via INTRAVENOUS

## 2017-06-06 MED ORDER — PROPOFOL 500 MG/50ML IV EMUL
INTRAVENOUS | Status: DC | PRN
  Administered 2017-06-06: 130 ug/kg/min via INTRAVENOUS

## 2017-06-06 MED ORDER — LIDOCAINE HCL (PF) 1 % IJ SOLN
INTRAMUSCULAR | Status: AC
  Administered 2017-06-06: 0.3 mL
  Filled 2017-06-06: qty 2

## 2017-06-06 MED ORDER — GLYCOPYRROLATE 0.2 MG/ML IJ SOLN
INTRAMUSCULAR | Status: DC | PRN
  Administered 2017-06-06: 0.2 mg via INTRAVENOUS

## 2017-06-06 MED ORDER — PROPOFOL 10 MG/ML IV BOLUS
INTRAVENOUS | Status: AC
Start: 2017-06-06 — End: 2017-06-06
  Filled 2017-06-06: qty 20

## 2017-06-06 MED ORDER — PROPOFOL 10 MG/ML IV BOLUS
INTRAVENOUS | Status: DC | PRN
  Administered 2017-06-06: 50 mg via INTRAVENOUS

## 2017-06-06 MED ORDER — LIDOCAINE HCL (PF) 2 % IJ SOLN
INTRAMUSCULAR | Status: AC
  Filled 2017-06-06: qty 10

## 2017-06-06 MED ORDER — SODIUM CHLORIDE 0.9 % IV SOLN
INTRAVENOUS | Status: DC
  Administered 2017-06-06: 1000 mL via INTRAVENOUS
  Administered 2017-06-06: 13:00:00 via INTRAVENOUS

## 2017-06-06 MED ORDER — PROPOFOL 500 MG/50ML IV EMUL
INTRAVENOUS | Status: AC
  Filled 2017-06-06: qty 50

## 2017-06-06 MED ORDER — GLYCOPYRROLATE 0.2 MG/ML IJ SOLN
INTRAMUSCULAR | Status: AC
  Filled 2017-06-06: qty 1

## 2017-06-06 MED ORDER — PHENYLEPHRINE HCL 10 MG/ML IJ SOLN
INTRAMUSCULAR | Status: DC | PRN
Start: 2017-06-06 — End: 2017-06-06
  Administered 2017-06-06: 100 ug via INTRAVENOUS

## 2017-06-06 NOTE — Transfer of Care (Signed)
Immediate Anesthesia Transfer of Care Note  Patient: Benjamin Austin  Procedure(s) Performed: ENDOSCOPIC RETROGRADE CHOLANGIOPANCREATOGRAPHY (ERCP) (N/A )  Patient Location: Endoscopy Unit  Anesthesia Type:General  Level of Consciousness: drowsy and patient cooperative  Airway & Oxygen Therapy: Patient Spontanous Breathing and Patient connected to nasal cannula oxygen  Post-op Assessment: Report given to RN and Post -op Vital signs reviewed and stable  Post vital signs: Reviewed and stable  Last Vitals:  Vitals:   06/06/17 1045 06/06/17 1344  BP: 140/82 (!) 98/53  Pulse: 78 78  Resp: 18 16  Temp: (!) 35.6 C (!) 35.9 C  SpO2: 100% 100%    Last Pain:  Vitals:   06/06/17 1344  TempSrc: Tympanic         Complications: No apparent anesthesia complications

## 2017-06-06 NOTE — Anesthesia Preprocedure Evaluation (Addendum)
Anesthesia Evaluation  Patient identified by MRN, date of birth, ID band Patient awake    Reviewed: Allergy & Precautions, H&P , NPO status , Patient's Chart, lab work & pertinent test results, reviewed documented beta blocker date and time   Airway Mallampati: II   Neck ROM: full    Dental  (+) Poor Dentition   Pulmonary neg pulmonary ROS, pneumonia, former smoker,    Pulmonary exam normal        Cardiovascular Exercise Tolerance: Poor hypertension, On Medications + Past MI  negative cardio ROS Normal cardiovascular exam Rhythm:regular Rate:Normal     Neuro/Psych negative neurological ROS  negative psych ROS   GI/Hepatic negative GI ROS, Neg liver ROS, GERD  ,  Endo/Other  negative endocrine ROSdiabetes  Renal/GU negative Renal ROS  negative genitourinary   Musculoskeletal  (+) Arthritis ,   Abdominal   Peds  Hematology negative hematology ROS (+)   Anesthesia Other Findings Past Medical History: No date: Arthritis No date: Diabetes mellitus without complication ([place]) 17/61/6073: Gall stones, common bile duct     Comment:  Gallbladder infection  No date: Hypercholesteremia No date: Hypertension 1994: Pneumonia No date: Tremors of nervous system Past Surgical History: No date: APPENDECTOMY 04/02/2017: CHOLECYSTECTOMY; N/A     Comment:  Procedure: LAPAROSCOPIC CHOLECYSTECTOMY WITH               INTRAOPERATIVE CHOLANGIOGRAM;  Surgeon: Jules Husbands,               MD;  Location: [facility] ORS;  Service: General;  Laterality:               N/A; 03/04/2017: ERCP; N/A     Comment:  Procedure: ENDOSCOPIC RETROGRADE               CHOLANGIOPANCREATOGRAPHY (ERCP);  Surgeon: Lucilla Lame,               MD;  Location: [facility] ENDOSCOPY;  Service: Endoscopy;                Laterality: N/A; 04/07/2017: ERCP; N/A     Comment:  Procedure: ENDOSCOPIC RETROGRADE               CHOLANGIOPANCREATOGRAPHY (ERCP);  Surgeon: Lucilla Lame,               MD;  Location: [facility] ENDOSCOPY;  Service: Endoscopy;                Laterality: N/A; No date: HERNIA REPAIR 01/18/2017: IR GUIDED DRAIN W CATHETER PLACEMENT BMI    Body Mass Index:  22.05 kg/m     Reproductive/Obstetrics negative OB ROS                            Anesthesia Physical Anesthesia Plan  ASA: III  Anesthesia Plan: General   Post-op Pain Management:    Induction:   PONV Risk Score and Plan:   Airway Management Planned:   Additional Equipment:   Intra-op Plan:   Post-operative Plan:   Informed Consent: I have reviewed the patients History and Physical, chart, labs and discussed the procedure including the risks, benefits and alternatives for the proposed anesthesia with the patient or authorized representative who has indicated his/her understanding and acceptance.   Dental Advisory Given  Plan Discussed with: CRNA  Anesthesia Plan Comments:         Anesthesia Quick Evaluation

## 2017-06-06 NOTE — Op Note (Signed)
[facility] Gastroenterology Patient Name: Benjamin Austin Procedure Date: 06/06/2017 12:53 PM MRN: [id number] Account #: [id number] Date of Birth: Feb 26, 1931 Admit Type: Outpatient Age: 83 Room: [facility] ENDO ROOM 4 Gender: Male Note Status: Finalized Procedure:            ERCP Indications:          Follow-up of bile leak, Biliary stent removal Providers:            Lucilla Lame MD, MD Medicines:            Propofol per Anesthesia Complications:        No immediate complications. Procedure:            Pre-Anesthesia Assessment:                       - Prior to the procedure, a History and Physical was                        performed, and patient medications and allergies were                        reviewed. The patient's tolerance of previous                        anesthesia was also reviewed. The risks and benefits of                        the procedure and the sedation options and risks were                        discussed with the patient. All questions were                        answered, and informed consent was obtained. Prior                        Anticoagulants: The patient has taken no previous                        anticoagulant or antiplatelet agents. ASA Grade                        Assessment: II - A patient with mild systemic disease.                        After reviewing the risks and benefits, the patient was                        deemed in satisfactory condition to undergo the                        procedure.                       After obtaining informed consent, the scope was passed                        under direct vision. Throughout the procedure, the                        patient's  blood pressure, pulse, and oxygen saturations                        were monitored continuously. The ERCP was introduced                        through the mouth, and used to inject contrast into and                        used to inject contrast into the  bile duct. The ERCP                        was accomplished without difficulty. The patient                        tolerated the procedure well. Findings:      A biliary stent was visible on the scout film. The esophagus was       successfully intubated under direct vision. The scope was advanced to a       normal major papilla in the descending duodenum without detailed       examination of the pharynx, larynx and associated structures, and upper       GI tract. The upper GI tract was grossly normal. Two stents were removed       from the biliary tree using a snare. A wire was passed into the biliary       tree. The bile duct was deeply cannulated with the short-nosed traction       sphincterotome. Contrast was injected. I personally interpreted the bile       duct images. There was brisk flow of contrast through the ducts. Image       quality was excellent. Contrast extended to the entire biliary tree. The       main bile duct was normal. Impression:           - Two stents were removed from the biliary tree. Recommendation:       - Discharge patient to home.                       - Resume previous diet.                       - Continue present medications. Procedure Code(s):    --- Professional ---                       [phone], Endoscopic retrograde cholangiopancreatography                        (ERCP); with removal of foreign body(s) or stent(s)                        from biliary/pancreatic duct(s)                       36144, Endoscopic catheterization of the biliary ductal                        system, radiological supervision and interpretation Diagnosis Code(s):    --- Professional ---  K83.8, Other specified diseases of biliary tract                       Z46.59, Encounter for fitting and adjustment of other                        gastrointestinal appliance and device CPT copyright 2016 American Medical Association. All rights reserved. The codes  documented in this report are preliminary and upon coder review may  be revised to meet current compliance requirements. Lucilla Lame MD, MD 06/06/2017 1:46:02 PM This report has been signed electronically. Number of Addenda: 0 Note Initiated On: 06/06/2017 12:53 PM Estimated Blood Loss: Estimated blood loss: none.      [facility]

## 2017-06-06 NOTE — Anesthesia Post-op Follow-up Note (Signed)
Anesthesia QCDR form completed.        

## 2017-06-06 NOTE — Anesthesia Postprocedure Evaluation (Signed)
Anesthesia Post Note  Patient: Benjamin Austin  Procedure(s) Performed: ENDOSCOPIC RETROGRADE CHOLANGIOPANCREATOGRAPHY (ERCP) (N/A )  Patient location during evaluation: Endoscopy Anesthesia Type: General Level of consciousness: awake and alert Pain management: pain level controlled Vital Signs Assessment: post-procedure vital signs reviewed and stable Respiratory status: spontaneous breathing, nonlabored ventilation, respiratory function stable and patient connected to nasal cannula oxygen Cardiovascular status: blood pressure returned to baseline and stable Postop Assessment: no apparent nausea or vomiting Anesthetic complications: no     Last Vitals:  Vitals:   06/06/17 1410 06/06/17 1420  BP: (!) 159/88 (!) 164/75  Pulse:    Resp:    Temp:    SpO2:      Last Pain:  Vitals:   06/06/17 1344  TempSrc: Tympanic                 Roxane [place]

## 2017-06-06 NOTE — H&P (Signed)
Lucilla Lame, MD [address] [place], [place] [address] Phone:[phone] Fax : [phone]  Primary Care Physician:  Dion Body, MD Primary Gastroenterologist:  Dr. Allen Norris  Pre-Procedure History & Physical: HPI:  Benjamin Austin is a 83 y.o. male is here for an ERCP.   Past Medical History:  Diagnosis Date  . Arthritis   . Diabetes mellitus without complication ([place])   . Gall stones, common bile duct 01/18/2017   Gallbladder infection   . Hypercholesteremia   . Hypertension   . Pneumonia 1994  . Tremors of nervous system     Past Surgical History:  Procedure Laterality Date  . APPENDECTOMY    . CHOLECYSTECTOMY N/A 04/02/2017   Procedure: LAPAROSCOPIC CHOLECYSTECTOMY WITH INTRAOPERATIVE CHOLANGIOGRAM;  Surgeon: Jules Husbands, MD;  Location: [facility] ORS;  Service: General;  Laterality: N/A;  . ERCP N/A 03/04/2017   Procedure: ENDOSCOPIC RETROGRADE CHOLANGIOPANCREATOGRAPHY (ERCP);  Surgeon: Lucilla Lame, MD;  Location: [facility] ENDOSCOPY;  Service: Endoscopy;  Laterality: N/A;  . ERCP N/A 04/07/2017   Procedure: ENDOSCOPIC RETROGRADE CHOLANGIOPANCREATOGRAPHY (ERCP);  Surgeon: Lucilla Lame, MD;  Location: [facility] ENDOSCOPY;  Service: Endoscopy;  Laterality: N/A;  . HERNIA REPAIR    . IR GUIDED DRAIN W CATHETER PLACEMENT  01/18/2017    Prior to Admission medications   Medication Sig Start Date End Date Taking? Authorizing Provider  Calcium Carbonate-Vitamin D 600-400 MG-UNIT tablet Take 1 tablet by mouth daily.    [provider]  Chlorpheniramine Maleate (ALLERGY RELIEF PO) Take 1 tablet by mouth daily as needed (for allergies).    [provider]  cyanocobalamin (,VITAMIN B-12,) 1000 MCG/ML injection Inject 1,000 mcg into the muscle every 30 (thirty) days.  05/18/14   [provider]  Docusate Calcium (STOOL SOFTENER PO) Take 1 capsule by mouth every other day.    [provider]  ferrous sulfate 325 (65 FE) MG tablet Take 1  tablet (325 mg total) 2 (two) times daily with a meal by mouth. 04/14/17   Pabon, [place], MD  lisinopril (PRINIVIL,ZESTRIL) 20 MG tablet Take 20 mg by mouth daily. 11/07/16   [provider]  metFORMIN (GLUCOPHAGE) 1000 MG tablet Take 1,000 mg by mouth 2 (two) times daily. 11/07/16   [provider]  Omega-3 1000 MG CAPS Take 1,000 mg by mouth daily.    [provider]  oxyCODONE (OXY IR/ROXICODONE) 5 MG immediate release tablet Take 1-2 tablets (5-10 mg total) by mouth every 4 (four) hours as needed for moderate pain. 04/09/17   Clayburn Pert, MD  pravastatin (PRAVACHOL) 20 MG tablet Take 10 mg by mouth at bedtime. 11/07/16   [provider]    Allergies as of 05/16/2017  . (No Known Allergies)    Family History  Problem Relation Age of Onset  . Heart disease Father     Social History   Socioeconomic History  . Marital status: Married    Spouse name: Not on file  . Number of children: Not on file  . Years of education: Not on file  . Highest education level: Not on file  Social Needs  . Financial resource strain: Not on file  . Food insecurity - worry: Not on file  . Food insecurity - inability: Not on file  . Transportation needs - medical: Not on file  . Transportation needs - non-medical: Not on file  Occupational History  . Not on file  Tobacco Use  . Smoking status: Former Smoker    Types:  Cigars  . Smokeless tobacco: Never Used  Substance and Sexual Activity  . Alcohol use: No  . Drug use: No  . Sexual activity: Not on file  Other Topics Concern  . Not on file  Social History Narrative  . Not on file    Review of Systems: See HPI, otherwise negative ROS  Physical Exam: BP 140/82   Pulse 78   Temp (!) 96 F (35.6 C) (Tympanic)   Resp 18   Ht 5\' 8"  (1.727 m)   Wt 145 lb (65.8 kg)   SpO2 100%   BMI 22.05 kg/m  General:   Alert,  pleasant and cooperative in NAD Head:  Normocephalic and atraumatic. Neck:  Supple; no  masses or thyromegaly. Lungs:  Clear throughout to auscultation.    Heart:  Regular rate and rhythm. Abdomen:  Soft, nontender and nondistended. Normal bowel sounds, without guarding, and without rebound.   Neurologic:  Alert and  oriented x4;  grossly normal neurologically.  Impression/Plan: Benjamin Austin is here for an ERCP to be performed for stent removal.  Risks, benefits, limitations, and alternatives regarding  ERCP have been reviewed with the patient.  Questions have been answered.  All parties agreeable.   Lucilla Lame, MD  06/06/2017, 12:37 PM

## 2017-06-09 ENCOUNTER — Encounter: Payer: Self-pay | Admitting: Gastroenterology

## 2017-06-09 DIAGNOSIS — Z48815 Encounter for surgical aftercare following surgery on the digestive system: Secondary | ICD-10-CM | POA: Diagnosis not present

## 2017-06-09 DIAGNOSIS — E119 Type 2 diabetes mellitus without complications: Secondary | ICD-10-CM | POA: Diagnosis not present

## 2017-06-09 DIAGNOSIS — I10 Essential (primary) hypertension: Secondary | ICD-10-CM | POA: Diagnosis not present

## 2017-06-09 DIAGNOSIS — Z4803 Encounter for change or removal of drains: Secondary | ICD-10-CM | POA: Diagnosis not present

## 2017-06-09 DIAGNOSIS — K838 Other specified diseases of biliary tract: Secondary | ICD-10-CM | POA: Diagnosis not present

## 2017-06-09 DIAGNOSIS — Z7984 Long term (current) use of oral hypoglycemic drugs: Secondary | ICD-10-CM | POA: Diagnosis not present

## 2017-06-13 DIAGNOSIS — Z48815 Encounter for surgical aftercare following surgery on the digestive system: Secondary | ICD-10-CM | POA: Diagnosis not present

## 2017-06-13 DIAGNOSIS — I10 Essential (primary) hypertension: Secondary | ICD-10-CM | POA: Diagnosis not present

## 2017-06-13 DIAGNOSIS — K838 Other specified diseases of biliary tract: Secondary | ICD-10-CM | POA: Diagnosis not present

## 2017-06-13 DIAGNOSIS — E119 Type 2 diabetes mellitus without complications: Secondary | ICD-10-CM | POA: Diagnosis not present

## 2017-06-16 DIAGNOSIS — E538 Deficiency of other specified B group vitamins: Secondary | ICD-10-CM | POA: Diagnosis not present

## 2017-07-17 DIAGNOSIS — E538 Deficiency of other specified B group vitamins: Secondary | ICD-10-CM | POA: Diagnosis not present

## 2017-08-18 DIAGNOSIS — E538 Deficiency of other specified B group vitamins: Secondary | ICD-10-CM | POA: Diagnosis not present

## 2017-09-18 DIAGNOSIS — E538 Deficiency of other specified B group vitamins: Secondary | ICD-10-CM | POA: Diagnosis not present

## 2017-10-20 DIAGNOSIS — E538 Deficiency of other specified B group vitamins: Secondary | ICD-10-CM | POA: Diagnosis not present

## 2017-11-07 DIAGNOSIS — E782 Mixed hyperlipidemia: Secondary | ICD-10-CM | POA: Diagnosis not present

## 2017-11-07 DIAGNOSIS — E119 Type 2 diabetes mellitus without complications: Secondary | ICD-10-CM | POA: Diagnosis not present

## 2017-11-13 DIAGNOSIS — Z862 Personal history of diseases of the blood and blood-forming organs and certain disorders involving the immune mechanism: Secondary | ICD-10-CM | POA: Diagnosis not present

## 2017-11-13 DIAGNOSIS — E119 Type 2 diabetes mellitus without complications: Secondary | ICD-10-CM | POA: Diagnosis not present

## 2017-11-13 DIAGNOSIS — E538 Deficiency of other specified B group vitamins: Secondary | ICD-10-CM | POA: Diagnosis not present

## 2017-11-13 DIAGNOSIS — E782 Mixed hyperlipidemia: Secondary | ICD-10-CM | POA: Diagnosis not present

## 2017-11-20 DIAGNOSIS — E538 Deficiency of other specified B group vitamins: Secondary | ICD-10-CM | POA: Diagnosis not present

## 2017-11-27 ENCOUNTER — Encounter: Payer: Self-pay | Admitting: *Deleted

## 2017-12-01 ENCOUNTER — Inpatient Hospital Stay: Payer: Medicare HMO | Attending: Internal Medicine | Admitting: Internal Medicine

## 2017-12-01 ENCOUNTER — Encounter: Payer: Self-pay | Admitting: Internal Medicine

## 2017-12-01 ENCOUNTER — Other Ambulatory Visit: Payer: Self-pay

## 2017-12-01 DIAGNOSIS — D649 Anemia, unspecified: Secondary | ICD-10-CM | POA: Insufficient documentation

## 2017-12-01 DIAGNOSIS — Z87891 Personal history of nicotine dependence: Secondary | ICD-10-CM | POA: Diagnosis not present

## 2017-12-01 DIAGNOSIS — R918 Other nonspecific abnormal finding of lung field: Secondary | ICD-10-CM | POA: Insufficient documentation

## 2017-12-01 NOTE — Progress Notes (Signed)
Benjamin Austin CONSULT NOTE  Patient Care Team: Dion Body, MD as PCP - General (Family Medicine)  CHIEF COMPLAINTS/PURPOSE OF CONSULTATION:  Anemia  # Iron deficiency anemia- iron deficiency [June 2019-PCP office-ferritin-10; iron 17]  #History of B12 deficiency-monthly B12 injections [chronic]   No history exists.     HISTORY OF PRESENTING ILLNESS:  Benjamin Austin 84 y.o.  male has been referred to [place] for further evaluation of anemia.  Patient said that he has been " low on iron for many years"; previously been taking iron pills.  He restarted back at the insistence of his PCP approximately 2 weeks ago.  Patient states that he was in the hospital in November 2952-[facility] stay was complicated post acute cholecystitis/gallbladder stone needing ERCP extraction. Patient still having difficulty adjusting to the hospital stay/postoperative complications.   Currently states his appetite is improving.  Denies any weight loss.  Denies any difficulty swallowing or nausea vomiting.  He states to have a colonoscopy in 2010 with Dr. Vira Agar; he is having any EGD.  Denies any difficulty swallowing.  He does admit to black stools while on iron pills.  Review of Systems  Constitutional: Negative for chills, diaphoresis, fever, malaise/fatigue and weight loss.  HENT: Negative for nosebleeds and sore throat.   Eyes: Negative for double vision.  Respiratory: Negative for cough, hemoptysis, sputum production, shortness of breath and wheezing.   Cardiovascular: Negative for chest pain, palpitations, orthopnea and leg swelling.  Gastrointestinal: Negative for abdominal pain, blood in stool, constipation, diarrhea, heartburn, melena, nausea and vomiting.  Genitourinary: Negative for dysuria, frequency and urgency.  Musculoskeletal: Negative for back pain and joint pain.  Skin: Negative.  Negative for itching and rash.  Neurological: Negative for dizziness, tingling, focal weakness,  weakness and headaches.  Endo/Heme/Allergies: Does not bruise/bleed easily.  Psychiatric/Behavioral: Negative for depression. The patient is not nervous/anxious and does not have insomnia.      MEDICAL HISTORY:  Past Medical History:  Diagnosis Date  . Arthritis   . B12 deficiency   . Diabetes mellitus without complication ([place])   . Gall stones, common bile duct 01/18/2017   Gallbladder infection   . GERD without esophagitis   . Hypercholesteremia   . Hypertension   . Normochromic anemia   . Pneumonia 1994  . Tremors of nervous system     SURGICAL HISTORY: Past Surgical History:  Procedure Laterality Date  . APPENDECTOMY    . CHOLECYSTECTOMY N/A 04/02/2017   Procedure: LAPAROSCOPIC CHOLECYSTECTOMY WITH INTRAOPERATIVE CHOLANGIOGRAM;  Surgeon: Jules Husbands, MD;  Location: [facility] ORS;  Service: General;  Laterality: N/A;  . COLONOSCOPY  12/16/2008  . ERCP N/A 03/04/2017   Procedure: ENDOSCOPIC RETROGRADE CHOLANGIOPANCREATOGRAPHY (ERCP);  Surgeon: Lucilla Lame, MD;  Location: [facility] ENDOSCOPY;  Service: Endoscopy;  Laterality: N/A;  . ERCP N/A 04/07/2017   Procedure: ENDOSCOPIC RETROGRADE CHOLANGIOPANCREATOGRAPHY (ERCP);  Surgeon: Lucilla Lame, MD;  Location[facility] ENDOSCOPY;  Service: Endoscopy;  Laterality: N/A;  . ERCP N/A 06/06/2017   Procedure: ENDOSCOPIC RETROGRADE CHOLANGIOPANCREATOGRAPHY (ERCP);  Surgeon: Lucilla Lame, MD;  Location: [facility] ENDOSCOPY;  Service: Endoscopy;  Laterality: N/A;  . HERNIA REPAIR    . IR GUIDED DRAIN W CATHETER PLACEMENT  01/18/2017  . PROSTATE BIOPSY     x 2; benign    SOCIAL HISTORY: Patient is a retired [employer]  Remote history of smoking/chews cigars.  No alcohol.  Patient is fairly independent of his daily living; lives with his wife at home. Social History   Socioeconomic  History  . Marital status: Married    Spouse name: Not on file  . Number of children: Not on file  . Years of education: Not on file  . Highest education level:  Not on file  Occupational History  . Not on file  Social Needs  . Financial resource strain: Not on file  . Food insecurity:    Worry: Not on file    Inability: Not on file  . Transportation needs:    Medical: Not on file    Non-medical: Not on file  Tobacco Use  . Smoking status: Former Smoker    Types: Cigars  . Smokeless tobacco: Never Used  Substance and Sexual Activity  . Alcohol use: No  . Drug use: No  . Sexual activity: Not on file  Lifestyle  . Physical activity:    Days per week: Not on file    Minutes per session: Not on file  . Stress: Not on file  Relationships  . Social connections:    Talks on phone: Not on file    Gets together: Not on file    Attends religious service: Not on file    Active member of club or organization: Not on file    Attends meetings of clubs or organizations: Not on file    Relationship status: Not on file  . Intimate partner violence:    Fear of current or ex partner: Not on file    Emotionally abused: Not on file    Physically abused: Not on file    Forced sexual activity: Not on file  Other Topics Concern  . Not on file  Social History Narrative  . Not on file    FAMILY HISTORY: Family History  Problem Relation Age of Onset  . Heart disease Father     ALLERGIES:  has No Known Allergies.  MEDICATIONS:  Current Outpatient Medications  Medication Sig Dispense Refill  . Calcium Carbonate-Vitamin D 600-400 MG-UNIT tablet Take 1 tablet by mouth daily.    . cyanocobalamin (,VITAMIN B-12,) 1000 MCG/ML injection Inject 1,000 mcg into the muscle every 30 (thirty) days.     Benjamin Austin Calcium (STOOL SOFTENER PO) Take 1 capsule by mouth every other day.    . ferrous sulfate 325 (65 FE) MG tablet Take 1 tablet (325 mg total) 2 (two) times daily with a meal by mouth. 60 tablet 1  . lisinopril (PRINIVIL,ZESTRIL) 20 MG tablet Take 20 mg by mouth daily.    . metFORMIN (GLUCOPHAGE) 1000 MG tablet Take 1,000 mg by mouth 2 (two) times  daily.    . Omega-3 1000 MG CAPS Take 1,000 mg by mouth daily.    . pravastatin (PRAVACHOL) 20 MG tablet Take 10 mg by mouth at bedtime.     No current facility-administered medications for this visit.       Marland Kitchen  PHYSICAL EXAMINATION: ECOG PERFORMANCE STATUS: 1 - Symptomatic but completely ambulatory  Vitals:   12/01/17 1150  BP: 126/73  Pulse: 69  Resp: 20  Temp: (!) 97 F (36.1 C)   Filed Weights   12/01/17 1150  Weight: 154 lb (69.9 kg)    Physical Exam  Constitutional: He is oriented to person, place, and time and well-developed, well-nourished, and in no distress.  HENT:  Head: Normocephalic and atraumatic.  Mouth/Throat: Oropharynx is clear and moist. No oropharyngeal exudate.  Eyes: Pupils are equal, round, and reactive to light.  Neck: Normal range of motion. Neck supple.  Cardiovascular: Normal rate and  regular rhythm.  Pulmonary/Chest: No respiratory distress. He has no wheezes.  Abdominal: Soft. Bowel sounds are normal. He exhibits no distension and no mass. There is no tenderness. There is no rebound and no guarding.  Musculoskeletal: Normal range of motion. He exhibits no edema or tenderness.  Neurological: He is alert and oriented to person, place, and time.  Skin: Skin is warm.  Psychiatric: Affect normal.     LABORATORY DATA:  I have reviewed the data as listed Lab Results  Component Value Date   WBC 18.8 (H) 04/21/2017   HGB 10.0 (L) 04/21/2017   HCT 30.6 (L) 04/21/2017   MCV 83.3 04/21/2017   PLT 520 (H) 04/21/2017   Recent Labs    04/08/17 0514 04/14/17 1422 04/21/17 0955  NA 138 136 137  K 3.1* 4.0 3.9  CL 105 101 100*  CO2 25 22 22   GLUCOSE 129* 217* 162*  BUN 14 21* 19  CREATININE 1.08 1.15 1.07  CALCIUM 8.1* 8.8* 9.3  GFRNONAA >60 57* >60  GFRAA >60 >60 >60  PROT 6.7 7.7 8.4*  ALBUMIN 2.5* 2.8* 3.0*  AST 16 30 49*  ALT 23 28 43  ALKPHOS 83 106 252*  BILITOT 0.9 0.5 0.5    RADIOGRAPHIC STUDIES: I have personally reviewed  the radiological images as listed and agreed with the findings in the report. No results found.  ASSESSMENT & PLAN:   Normochromic normocytic anemia #Mild anemia hemoglobin on 10; iron studies-show iron deficiency [PCP office-ferritin-10; iron 17].  Patient is currently on p.o. iron twice a day tolerating well without any GI discomfort.    #Etiology of iron deficiency unclear-recommend repeat EGD colonoscopy; and if negative a possible capsule study.  Patient is interested in following up with Dr. Elliott/previous colonoscopy.  #Incidental lung nodules up to 9 mm-CT scan November 2018; no follow-up imaging noted; will discuss with patient at next visit.  #Patient reluctant with any extensive work-up or blood work at this time.  As patient is not clinically bleeding/having severe anemia; think it is reasonable to hold any work-up today; and repeat labs at next visit.   # follow up in 6- 8 weeks/labs iron studies ferritin/LDH/UA.   Thank you Dr.Linthavong for allowing me to participate in the care of your pleasant patient. Please do not hesitate to contact me with questions or concerns in the interim.   Cc; Dr.L    All questions were answered. The patient knows to call the clinic with any problems, questions or concerns.       Cammie Sickle, MD 12/02/2017 8:03 AM

## 2017-12-01 NOTE — Assessment & Plan Note (Addendum)
#  Mild anemia hemoglobin on 10; iron studies-show iron deficiency [PCP office-ferritin-10; iron 17].  Patient is currently on p.o. iron twice a day tolerating well without any GI discomfort.    #Etiology of iron deficiency unclear-recommend repeat EGD colonoscopy; and if negative a possible capsule study.  Patient is interested in following up with Dr. Elliott/previous colonoscopy.  #Incidental lung nodules up to 9 mm-CT scan November 2018; no follow-up imaging noted; will discuss with patient at next visit.  #Patient reluctant with any extensive work-up or blood work at this time.  As patient is not clinically bleeding/having severe anemia; think it is reasonable to hold any work-up today; and repeat labs at next visit.   # follow up in 6- 8 weeks/labs iron studies ferritin/LDH/UA.   Thank you Dr.Linthavong for allowing me to participate in the care of your pleasant patient. Please do not hesitate to contact me with questions or concerns in the interim.   Cc; Dr.L

## 2017-12-22 DIAGNOSIS — E538 Deficiency of other specified B group vitamins: Secondary | ICD-10-CM | POA: Diagnosis not present

## 2018-01-12 ENCOUNTER — Encounter: Payer: Self-pay | Admitting: Internal Medicine

## 2018-01-12 ENCOUNTER — Other Ambulatory Visit: Payer: Self-pay

## 2018-01-12 ENCOUNTER — Inpatient Hospital Stay: Payer: Medicare HMO | Admitting: Internal Medicine

## 2018-01-12 ENCOUNTER — Inpatient Hospital Stay: Payer: Medicare HMO | Attending: Internal Medicine

## 2018-01-12 VITALS — BP 147/110 | HR 64 | Temp 97.5°F | Resp 20 | Ht 68.0 in | Wt 157.0 lb

## 2018-01-12 DIAGNOSIS — D649 Anemia, unspecified: Secondary | ICD-10-CM

## 2018-01-12 DIAGNOSIS — D509 Iron deficiency anemia, unspecified: Secondary | ICD-10-CM | POA: Insufficient documentation

## 2018-01-12 DIAGNOSIS — Z87891 Personal history of nicotine dependence: Secondary | ICD-10-CM

## 2018-01-12 DIAGNOSIS — R5383 Other fatigue: Secondary | ICD-10-CM

## 2018-01-12 LAB — CBC WITH DIFFERENTIAL/PLATELET
BASOS ABS: 0.1 10*3/uL (ref 0–0.1)
Basophils Relative: 1 %
EOS ABS: 0.5 10*3/uL (ref 0–0.7)
EOS PCT: 7 %
HCT: 40.5 % (ref 40.0–52.0)
Hemoglobin: 13.2 g/dL (ref 13.0–18.0)
LYMPHS PCT: 21 %
Lymphs Abs: 1.7 10*3/uL (ref 1.0–3.6)
MCH: 28.5 pg (ref 26.0–34.0)
MCHC: 32.7 g/dL (ref 32.0–36.0)
MCV: 87.1 fL (ref 80.0–100.0)
Monocytes Absolute: 0.9 10*3/uL (ref 0.2–1.0)
Monocytes Relative: 11 %
Neutro Abs: 4.7 10*3/uL (ref 1.4–6.5)
Neutrophils Relative %: 60 %
PLATELETS: 213 10*3/uL (ref 150–440)
RBC: 4.65 MIL/uL (ref 4.40–5.90)
RDW: 20.1 % — ABNORMAL HIGH (ref 11.5–14.5)
WBC: 7.8 10*3/uL (ref 3.8–10.6)

## 2018-01-12 LAB — COMPREHENSIVE METABOLIC PANEL
ALT: 39 U/L (ref 0–44)
AST: 37 U/L (ref 15–41)
Albumin: 3.9 g/dL (ref 3.5–5.0)
Alkaline Phosphatase: 174 U/L — ABNORMAL HIGH (ref 38–126)
Anion gap: 9 (ref 5–15)
BILIRUBIN TOTAL: 0.2 mg/dL — AB (ref 0.3–1.2)
BUN: 19 mg/dL (ref 8–23)
CO2: 24 mmol/L (ref 22–32)
CREATININE: 1.21 mg/dL (ref 0.61–1.24)
Calcium: 9.7 mg/dL (ref 8.9–10.3)
Chloride: 107 mmol/L (ref 98–111)
GFR, EST NON AFRICAN AMERICAN: 53 mL/min — AB (ref >60)
Glucose, Bld: 159 mg/dL — ABNORMAL HIGH (ref 70–99)
Potassium: 4.8 mmol/L (ref 3.5–5.1)
Sodium: 140 mmol/L (ref 135–145)
TOTAL PROTEIN: 8.1 g/dL (ref 6.5–8.1)

## 2018-01-12 LAB — IRON AND TIBC
IRON: 44 ug/dL — AB (ref 45–182)
SATURATION RATIOS: 11 % — AB (ref 17.9–39.5)
TIBC: 385 ug/dL (ref 250–450)
UIBC: 341 ug/dL

## 2018-01-12 LAB — URINALYSIS, COMPLETE (UACMP) WITH MICROSCOPIC
BILIRUBIN URINE: NEGATIVE
Bacteria, UA: NONE SEEN
GLUCOSE, UA: NEGATIVE mg/dL
Hgb urine dipstick: NEGATIVE
KETONES UR: NEGATIVE mg/dL
LEUKOCYTES UA: NEGATIVE
NITRITE: NEGATIVE
PH: 5 (ref 5.0–8.0)
Protein, ur: NEGATIVE mg/dL
Specific Gravity, Urine: 1.015 (ref 1.005–1.030)

## 2018-01-12 LAB — VITAMIN B12: VITAMIN B 12: 546 pg/mL (ref 180–914)

## 2018-01-12 LAB — LACTATE DEHYDROGENASE: LDH: 88 U/L — ABNORMAL LOW (ref 98–192)

## 2018-01-12 LAB — FERRITIN: FERRITIN: 32 ng/mL (ref 24–336)

## 2018-01-12 NOTE — Progress Notes (Signed)
[facility] NOTE  Patient Care Team: Dion Body, MD as PCP - General (Family Medicine) Manya Silvas, MD (Gastroenterology)  CHIEF COMPLAINTS/PURPOSE OF CONSULTATION:  Anemia  # Iron deficiency anemia- iron deficiency [June 2019-PCP; Dr.Linthavong office-ferritin-10; iron 17]; colonoscopy in 2010 with Dr. Vira Agar; NO-EGD.  #History of B12 deficiency-monthly B12 injections [chronic]; 2018 November-acute cholecystitis/cholecystectomy-ERCP extraction.   No history exists.     HISTORY OF PRESENTING ILLNESS:  Benjamin Austin 84 y.o.  male iron deficiency anemia on p.o. iron is here for follow-up.  Patient denies having any significant side effects from taking p.o. iron like constipation or abdominal discomfort.   His energy levels are improving.  The complains of mild fatigue.  Otherwise no nausea vomiting.  No difficulty swallowing.  Review of Systems  Constitutional: Positive for malaise/fatigue. Negative for chills, diaphoresis, fever and weight loss.  HENT: Negative for nosebleeds and sore throat.   Eyes: Negative for double vision.  Respiratory: Negative for cough, hemoptysis, sputum production, shortness of breath and wheezing.   Cardiovascular: Negative for chest pain, palpitations, orthopnea and leg swelling.  Gastrointestinal: Negative for abdominal pain, blood in stool, constipation, diarrhea, heartburn, melena, nausea and vomiting.  Genitourinary: Negative for dysuria, frequency and urgency.  Musculoskeletal: Negative for back pain and joint pain.  Skin: Negative.  Negative for itching and rash.  Neurological: Negative for dizziness, tingling, focal weakness, weakness and headaches.  Endo/Heme/Allergies: Does not bruise/bleed easily.  Psychiatric/Behavioral: Negative for depression. The patient is not nervous/anxious and does not have insomnia.      MEDICAL HISTORY:  Past Medical History:  Diagnosis Date  . Arthritis   . B12  deficiency   . Diabetes mellitus without complication ([place])   . Gall stones, common bile duct 01/18/2017   Gallbladder infection   . GERD without esophagitis   . Hypercholesteremia   . Hypertension   . Normochromic anemia   . Pneumonia 1994  . Tremors of nervous system     SURGICAL HISTORY: Past Surgical History:  Procedure Laterality Date  . APPENDECTOMY    . CHOLECYSTECTOMY N/A 04/02/2017   Procedure: LAPAROSCOPIC CHOLECYSTECTOMY WITH INTRAOPERATIVE CHOLANGIOGRAM;  Surgeon: Jules Husbands, MD;  Location: [facility] ORS;  Service: General;  Laterality: N/A;  . COLONOSCOPY  12/16/2008  . ERCP N/A 03/04/2017   Procedure: ENDOSCOPIC RETROGRADE CHOLANGIOPANCREATOGRAPHY (ERCP);  Surgeon: Lucilla Lame, MD;  Location: [facility] ENDOSCOPY;  Service: Endoscopy;  Laterality: N/A;  . ERCP N/A 04/07/2017   Procedure: ENDOSCOPIC RETROGRADE CHOLANGIOPANCREATOGRAPHY (ERCP);  Surgeon: Lucilla Lame, MD;  Location: [facility] ENDOSCOPY;  Service: Endoscopy;  Laterality: N/A;  . ERCP N/A 06/06/2017   Procedure: ENDOSCOPIC RETROGRADE CHOLANGIOPANCREATOGRAPHY (ERCP);  Surgeon: Lucilla Lame, MD;  Location: [facility] ENDOSCOPY;  Service: Endoscopy;  Laterality: N/A;  . HERNIA REPAIR    . IR GUIDED DRAIN W CATHETER PLACEMENT  01/18/2017  . PROSTATE BIOPSY     x 2; benign    SOCIAL HISTORY: Patient is a retired [employer]in [employer].;  Remote history of smoking/chews cigars.  No alcohol.  Patient is fairly independent of his daily living; lives with his wife at home. Social History   Socioeconomic History  . Marital status: Married    Spouse name: Not on file  . Number of children: Not on file  . Years of education: Not on file  . Highest education level: Not on file  Occupational History  . Not on file  Social Needs  . Financial resource strain: Not on file  . Food insecurity:  Worry: Not on file    Inability: Not on file  . Transportation needs:    Medical: Not on file    Non-medical: Not on file  Tobacco Use  .  Smoking status: Former Smoker    Types: Cigars  . Smokeless tobacco: Never Used  Substance and Sexual Activity  . Alcohol use: No  . Drug use: No  . Sexual activity: Not on file  Lifestyle  . Physical activity:    Days per week: Not on file    Minutes per session: Not on file  . Stress: Not on file  Relationships  . Social connections:    Talks on phone: Not on file    Gets together: Not on file    Attends religious service: Not on file    Active member of club or organization: Not on file    Attends meetings of clubs or organizations: Not on file    Relationship status: Not on file  . Intimate partner violence:    Fear of current or ex partner: Not on file    Emotionally abused: Not on file    Physically abused: Not on file    Forced sexual activity: Not on file  Other Topics Concern  . Not on file  Social History Narrative  . Not on file    FAMILY HISTORY: Family History  Problem Relation Age of Onset  . Heart disease Father     ALLERGIES:  has No Known Allergies.  MEDICATIONS:  Current Outpatient Medications  Medication Sig Dispense Refill  . Calcium Carbonate-Vitamin D 600-400 MG-UNIT tablet Take 1 tablet by mouth daily.    . cyanocobalamin (,VITAMIN B-12,) 1000 MCG/ML injection Inject 1,000 mcg into the muscle every 30 (thirty) days.     Mariane Baumgarten Calcium (STOOL SOFTENER PO) Take 1 capsule by mouth every other day.    . ferrous sulfate 325 (65 FE) MG tablet Take 1 tablet (325 mg total) 2 (two) times daily with a meal by mouth. 60 tablet 1  . lisinopril (PRINIVIL,ZESTRIL) 20 MG tablet Take 20 mg by mouth daily.    . metFORMIN (GLUCOPHAGE) 1000 MG tablet Take 1,000 mg by mouth 2 (two) times daily.    . Omega-3 1000 MG CAPS Take 1,000 mg by mouth daily.    . pravastatin (PRAVACHOL) 20 MG tablet Take 10 mg by mouth at bedtime.     No current facility-administered medications for this visit.       Marland Kitchen  PHYSICAL EXAMINATION: ECOG PERFORMANCE STATUS: 1 -  Symptomatic but completely ambulatory  Vitals:   01/12/18 1057  BP: (!) 147/110  Pulse: 64  Resp: 20  Temp: (!) 97.5 F (36.4 C)   Filed Weights   01/12/18 1057  Weight: 157 lb (71.2 kg)    Physical Exam  Constitutional: He is oriented to person, place, and time and well-developed, well-nourished, and in no distress.  HENT:  Head: Normocephalic and atraumatic.  Mouth/Throat: Oropharynx is clear and moist. No oropharyngeal exudate.  Eyes: Pupils are equal, round, and reactive to light.  Neck: Normal range of motion. Neck supple.  Cardiovascular: Normal rate and regular rhythm.  Pulmonary/Chest: No respiratory distress. He has no wheezes.  Abdominal: Soft. Bowel sounds are normal. He exhibits no distension and no mass. There is no tenderness. There is no rebound and no guarding.  Musculoskeletal: Normal range of motion. He exhibits no edema or tenderness.  Neurological: He is alert and oriented to person, place, and time.  Skin: Skin  is warm.  Psychiatric: Affect normal.     LABORATORY DATA:  I have reviewed the data as listed Lab Results  Component Value Date   WBC 7.8 01/12/2018   HGB 13.2 01/12/2018   HCT 40.5 01/12/2018   MCV 87.1 01/12/2018   PLT 213 01/12/2018   Recent Labs    04/14/17 1422 04/21/17 0955 01/12/18 1037  NA 136 137 140  K 4.0 3.9 4.8  CL 101 100* 107  CO2 22 22 24   GLUCOSE 217* 162* 159*  BUN 21* 19 19  CREATININE 1.15 1.07 1.21  CALCIUM 8.8* 9.3 9.7  GFRNONAA 57* >60 53*  GFRAA >60 >60 >60  PROT 7.7 8.4* 8.1  ALBUMIN 2.8* 3.0* 3.9  AST 30 49* 37  ALT 28 43 39  ALKPHOS 106 252* 174*  BILITOT 0.5 0.5 0.2*    RADIOGRAPHIC STUDIES: I have personally reviewed the radiological images as listed and agreed with the findings in the report. No results found.  ASSESSMENT & PLAN:   Normochromic normocytic anemia #Iron deficiency anemia-hemoglobin 13 improved from 10 on p.o. Iron.  However saturation still 10%.  Continue p.o.  iron.  #Etiology of iron deficiency unclear-recommend further GI work-up.  Referred back to GI.  #Recommend follow up in 4 months/cbc; GI follow up.   Cc; Dr.L    All questions were answered. The patient knows to call the clinic with any problems, questions or concerns.       Cammie Sickle, MD 02/02/2018 9:58 PM

## 2018-01-12 NOTE — Patient Instructions (Signed)
Recommend follow up [place]

## 2018-01-12 NOTE — Assessment & Plan Note (Addendum)
#  Iron deficiency anemia-hemoglobin 13 improved from 10 on p.o. Iron.  However saturation still 10%.  Continue p.o. iron.  #Etiology of iron deficiency unclear-recommend further GI work-up.  Referred back to GI.  #Recommend follow up in 4 months/cbc; GI follow up.   Cc; Dr.L

## 2018-01-19 DIAGNOSIS — D509 Iron deficiency anemia, unspecified: Secondary | ICD-10-CM | POA: Diagnosis not present

## 2018-01-22 DIAGNOSIS — E538 Deficiency of other specified B group vitamins: Secondary | ICD-10-CM | POA: Diagnosis not present

## 2018-02-23 DIAGNOSIS — E538 Deficiency of other specified B group vitamins: Secondary | ICD-10-CM | POA: Diagnosis not present

## 2018-03-23 DIAGNOSIS — D509 Iron deficiency anemia, unspecified: Secondary | ICD-10-CM | POA: Diagnosis not present

## 2018-03-26 DIAGNOSIS — H2513 Age-related nuclear cataract, bilateral: Secondary | ICD-10-CM | POA: Diagnosis not present

## 2018-03-26 DIAGNOSIS — E538 Deficiency of other specified B group vitamins: Secondary | ICD-10-CM | POA: Diagnosis not present

## 2018-04-27 DIAGNOSIS — E538 Deficiency of other specified B group vitamins: Secondary | ICD-10-CM | POA: Diagnosis not present

## 2018-04-29 DIAGNOSIS — H2511 Age-related nuclear cataract, right eye: Secondary | ICD-10-CM | POA: Diagnosis not present

## 2018-04-30 ENCOUNTER — Encounter: Payer: Self-pay | Admitting: *Deleted

## 2018-05-12 ENCOUNTER — Encounter: Payer: Self-pay | Admitting: *Deleted

## 2018-05-12 ENCOUNTER — Ambulatory Visit: Payer: Medicare HMO | Admitting: Certified Registered Nurse Anesthetist

## 2018-05-12 ENCOUNTER — Encounter
Admission: RE | Disposition: A | Discharge status: Home / Self Care | Payer: Self-pay | Source: Ambulatory Visit | Attending: Ophthalmology

## 2018-05-12 ENCOUNTER — Ambulatory Visit
Admission: RE | Admit: 2018-05-12 | Discharge: 2018-05-12 | Disposition: A | Discharge status: Home / Self Care | Payer: Medicare HMO | Source: Ambulatory Visit | Attending: Ophthalmology | Admitting: Ophthalmology

## 2018-05-12 DIAGNOSIS — E1136 Type 2 diabetes mellitus with diabetic cataract: Secondary | ICD-10-CM | POA: Diagnosis not present

## 2018-05-12 DIAGNOSIS — D649 Anemia, unspecified: Secondary | ICD-10-CM | POA: Diagnosis not present

## 2018-05-12 DIAGNOSIS — Z79899 Other long term (current) drug therapy: Secondary | ICD-10-CM | POA: Diagnosis not present

## 2018-05-12 DIAGNOSIS — I10 Essential (primary) hypertension: Secondary | ICD-10-CM | POA: Insufficient documentation

## 2018-05-12 DIAGNOSIS — Z7984 Long term (current) use of oral hypoglycemic drugs: Secondary | ICD-10-CM | POA: Diagnosis not present

## 2018-05-12 DIAGNOSIS — I252 Old myocardial infarction: Secondary | ICD-10-CM | POA: Insufficient documentation

## 2018-05-12 DIAGNOSIS — E119 Type 2 diabetes mellitus without complications: Secondary | ICD-10-CM | POA: Diagnosis not present

## 2018-05-12 DIAGNOSIS — Z87891 Personal history of nicotine dependence: Secondary | ICD-10-CM | POA: Diagnosis not present

## 2018-05-12 DIAGNOSIS — E782 Mixed hyperlipidemia: Secondary | ICD-10-CM | POA: Diagnosis not present

## 2018-05-12 DIAGNOSIS — H2511 Age-related nuclear cataract, right eye: Secondary | ICD-10-CM | POA: Diagnosis not present

## 2018-05-12 DIAGNOSIS — E7800 Pure hypercholesterolemia, unspecified: Secondary | ICD-10-CM | POA: Diagnosis not present

## 2018-05-12 HISTORY — PX: CATARACT EXTRACTION W/PHACO: SHX586

## 2018-05-12 HISTORY — DX: Unspecified hearing loss, unspecified ear: H91.90

## 2018-05-12 HISTORY — DX: Angina pectoris, unspecified: I20.9

## 2018-05-12 HISTORY — DX: Cardiac murmur, unspecified: R01.1

## 2018-05-12 LAB — GLUCOSE, CAPILLARY: GLUCOSE-CAPILLARY: 128 mg/dL — AB (ref 70–99)

## 2018-05-12 SURGERY — PHACOEMULSIFICATION, CATARACT, WITH IOL INSERTION
Anesthesia: Monitor Anesthesia Care | Site: Eye | Laterality: Right

## 2018-05-12 MED ORDER — TRYPAN BLUE 0.06 % OP SOLN
OPHTHALMIC | Status: AC
  Filled 2018-05-12: qty 0.5

## 2018-05-12 MED ORDER — LIDOCAINE HCL (PF) 4 % IJ SOLN
INTRAMUSCULAR | Status: AC
  Filled 2018-05-12: qty 5

## 2018-05-12 MED ORDER — ARMC OPHTHALMIC DILATING DROPS
1.0000 "application " | OPHTHALMIC | Status: AC
  Administered 2018-05-12 (×2): 1 via OPHTHALMIC

## 2018-05-12 MED ORDER — SODIUM CHLORIDE 0.9 % IV SOLN
INTRAVENOUS | Status: DC
  Administered 2018-05-12: 07:00:00 via INTRAVENOUS

## 2018-05-12 MED ORDER — NA CHONDROIT SULF-NA HYALURON 40-17 MG/ML IO SOLN
INTRAOCULAR | Status: DC | PRN
  Administered 2018-05-12: 1 mL via INTRAOCULAR

## 2018-05-12 MED ORDER — MIDAZOLAM HCL 2 MG/2ML IJ SOLN
INTRAMUSCULAR | Status: DC | PRN
  Administered 2018-05-12 (×2): 0.5 mg via INTRAVENOUS

## 2018-05-12 MED ORDER — MOXIFLOXACIN HCL 0.5 % OP SOLN
OPHTHALMIC | Status: DC | PRN
  Administered 2018-05-12: .2 mL via OPHTHALMIC

## 2018-05-12 MED ORDER — LIDOCAINE HCL (PF) 4 % IJ SOLN
INTRAOCULAR | Status: DC | PRN
  Administered 2018-05-12: 2 mL via OPHTHALMIC

## 2018-05-12 MED ORDER — TETRACAINE HCL 0.5 % OP SOLN
OPHTHALMIC | Status: AC
  Administered 2018-05-12: 1 [drp] via OPHTHALMIC
  Filled 2018-05-12: qty 4

## 2018-05-12 MED ORDER — MIDAZOLAM HCL 2 MG/2ML IJ SOLN
INTRAMUSCULAR | Status: AC
  Filled 2018-05-12: qty 2

## 2018-05-12 MED ORDER — CARBACHOL 0.01 % IO SOLN
INTRAOCULAR | Status: DC | PRN
  Administered 2018-05-12: .5 mL via INTRAOCULAR

## 2018-05-12 MED ORDER — ARMC OPHTHALMIC DILATING DROPS
OPHTHALMIC | Status: AC
  Administered 2018-05-12: 1 via OPHTHALMIC
  Filled 2018-05-12: qty 0.5

## 2018-05-12 MED ORDER — POVIDONE-IODINE 5 % OP SOLN
OPHTHALMIC | Status: DC | PRN
  Administered 2018-05-12: 1 via OPHTHALMIC

## 2018-05-12 MED ORDER — NA CHONDROIT SULF-NA HYALURON 40-17 MG/ML IO SOLN
INTRAOCULAR | Status: AC
  Filled 2018-05-12: qty 1

## 2018-05-12 MED ORDER — MOXIFLOXACIN HCL 0.5 % OP SOLN
OPHTHALMIC | Status: AC
  Filled 2018-05-12: qty 3

## 2018-05-12 MED ORDER — EPINEPHRINE PF 1 MG/ML IJ SOLN
INTRAOCULAR | Status: DC | PRN
  Administered 2018-05-12: 1 mL via OPHTHALMIC

## 2018-05-12 MED ORDER — MOXIFLOXACIN HCL 0.5 % OP SOLN: 1.0000 [drp] | OPHTHALMIC | Status: DC | PRN

## 2018-05-12 MED ORDER — EPINEPHRINE PF 1 MG/ML IJ SOLN
INTRAMUSCULAR | Status: AC
  Filled 2018-05-12: qty 1

## 2018-05-12 MED ORDER — TETRACAINE HCL 0.5 % OP SOLN
1.0000 [drp] | OPHTHALMIC | Status: AC
  Administered 2018-05-12 (×2): 1 [drp] via OPHTHALMIC

## 2018-05-12 MED ORDER — POVIDONE-IODINE 5 % OP SOLN
OPHTHALMIC | Status: AC
  Filled 2018-05-12: qty 30

## 2018-05-12 SURGICAL SUPPLY — 16 items

## 2018-05-12 NOTE — H&P (Signed)
All labs reviewed. Abnormal studies sent to patients PCP when indicated.  Previous H&P reviewed, patient examined, there are NO CHANGES.  Benjamin Elzey Porfilio12/3/20197:16 AM

## 2018-05-12 NOTE — Op Note (Signed)
PREOPERATIVE DIAGNOSIS:  Nuclear sclerotic cataract of the right eye.   POSTOPERATIVE DIAGNOSIS:  nuclear sclerotic cataract right eye   OPERATIVE PROCEDURE: Procedure(s): CATARACT EXTRACTION PHACO AND INTRAOCULAR LENS PLACEMENT (IOC)   SURGEON:  Birder Robson, MD.   ANESTHESIA:  Anesthesiologist: Alvin Critchley, MD CRNA: Eben Burow, CRNA  1.      Managed anesthesia care. 2.      0.61ml of Shugarcaine was instilled in the eye following the paracentesis.   COMPLICATIONS:  None.   TECHNIQUE:   Stop and chop   DESCRIPTION OF PROCEDURE:  The patient was examined and consented in the preoperative holding area where the aforementioned topical anesthesia was applied to the right eye and then brought back to the Operating Room where the right eye was prepped and draped in the usual sterile ophthalmic fashion and a lid speculum was placed. A paracentesis was created with the side port blade and the anterior chamber was filled with viscoelastic. A near clear corneal incision was performed with the steel keratome. A continuous curvilinear capsulorrhexis was performed with a cystotome followed by the capsulorrhexis forceps. Hydrodissection and hydrodelineation were carried out with BSS on a blunt cannula. The lens was removed in a stop and chop  technique and the remaining cortical material was removed with the irrigation-aspiration handpiece. The capsular bag was inflated with viscoelastic and the Technis [id number]  lens was placed in the capsular bag without complication. The remaining viscoelastic was removed from the eye with the irrigation-aspiration handpiece. The wounds were hydrated. The anterior chamber was flushed with Miostat and the eye was inflated to physiologic pressure. 0.11ml of Vigamox was placed in the anterior chamber. The wounds were found to be water tight. The eye was dressed with Vigamox. The patient was given protective glasses to wear throughout the day and a shield with which to  sleep tonight. The patient was also given drops with which to begin a drop regimen today and will follow-up with me in one day. Implant Name Type Inv. Item Serial No. Manufacturer Lot No. LRB No. Used  LENS IOL DIOP 21.0 - [id number] Intraocular Lens LENS IOL DIOP 21.0 [id number] AMO  Right 1   Procedure(s) with comments: CATARACT EXTRACTION PHACO AND INTRAOCULAR LENS PLACEMENT (IOC) (Right) - [place]  00:43 CDE 4.74 Fluid pack lot # [id number]  Electronically signed: Birder Robson 05/12/2018 7:48 AM

## 2018-05-12 NOTE — Anesthesia Post-op Follow-up Note (Signed)
Anesthesia QCDR form completed.        

## 2018-05-12 NOTE — Transfer of Care (Signed)
Immediate Anesthesia Transfer of Care Note  Patient: Benjamin Austin  Procedure(s) Performed: CATARACT EXTRACTION PHACO AND INTRAOCULAR LENS PLACEMENT (IOC) (Right Eye)  Patient Location: Short Stay  Anesthesia Type:MAC  Level of Consciousness: awake, alert , oriented and patient cooperative  Airway & Oxygen Therapy: Patient Spontanous Breathing  Post-op Assessment: Report given to RN and Post -op Vital signs reviewed and stable  Post vital signs: Reviewed and stable  Last Vitals:  Vitals Value Taken Time  BP    Temp    Pulse    Resp    SpO2      Last Pain:  Vitals:   05/12/18 0620  TempSrc: Tympanic  PainSc: 0-No pain         Complications: No apparent anesthesia complications

## 2018-05-12 NOTE — Discharge Instructions (Signed)
Eye Surgery Discharge Instructions    Expect mild scratchy sensation or mild soreness. DO NOT RUB YOUR EYE!  The day of surgery:  Minimal physical activity, but bed rest is not required  No reading, computer work, or close hand work  No bending, lifting, or straining.  May watch TV  For 24 hours:  No driving, legal decisions, or alcoholic beverages  Safety precautions  Eat anything you prefer: It is better to start with liquids, then soup then solid foods.  _____ Eye patch should be worn until postoperative exam tomorrow.  ____ Solar shield eyeglasses should be worn for comfort in the sunlight/patch while sleeping  Resume all regular medications including aspirin or Coumadin if these were discontinued prior to surgery. You may shower, bathe, shave, or wash your hair. Tylenol may be taken for mild discomfort.  Call your doctor if you experience significant pain, nausea, or vomiting, fever > 101 or other signs of infection. [phone] or [phone] Specific instructions:  Follow-up Information    Birder Robson, MD Follow up on 05/13/2018.   Specialty:  Ophthalmology Why:  appointment time at 10:25 AM Contact information: [address] [place] [address] [phone]

## 2018-05-12 NOTE — Anesthesia Postprocedure Evaluation (Signed)
Anesthesia Post Note  Patient: Benjamin Austin  Procedure(s) Performed: CATARACT EXTRACTION PHACO AND INTRAOCULAR LENS PLACEMENT (IOC) (Right Eye)  Patient location during evaluation: Short Stay Anesthesia Type: MAC Level of consciousness: awake and alert, oriented and patient cooperative Pain management: satisfactory to patient Vital Signs Assessment: post-procedure vital signs reviewed and stable Respiratory status: spontaneous breathing, respiratory function stable and nonlabored ventilation Cardiovascular status: blood pressure returned to baseline and stable Postop Assessment: no headache and no apparent nausea or vomiting Anesthetic complications: no     Last Vitals:  Vitals:   05/12/18 0620 05/12/18 0750  BP: 126/81 (!) 155/63  Pulse: (!) 57 (!) 57  Resp: 16 16  Temp: (!) 35.7 C (!) 36.2 C  SpO2: 96% 98%    Last Pain:  Vitals:   05/12/18 0750  TempSrc: Temporal  PainSc: 0-No pain                 Eben Burow

## 2018-05-12 NOTE — Anesthesia Preprocedure Evaluation (Signed)
Anesthesia Evaluation  Patient identified by MRN, date of birth, ID band Patient awake    Reviewed: Allergy & Precautions, H&P , NPO status , Patient's Chart, lab work & pertinent test results, reviewed documented beta blocker date and time   Airway Mallampati: II   Neck ROM: full    Dental  (+) Poor Dentition   Pulmonary neg pulmonary ROS, pneumonia, former smoker,    Pulmonary exam normal        Cardiovascular Exercise Tolerance: Poor hypertension, On Medications + angina + Past MI  Normal cardiovascular exam Rhythm:regular Rate:Normal     Neuro/Psych negative neurological ROS  negative psych ROS   GI/Hepatic negative GI ROS, Neg liver ROS, GERD  ,  Endo/Other  negative endocrine ROSdiabetes  Renal/GU negative Renal ROS  negative genitourinary   Musculoskeletal  (+) Arthritis ,   Abdominal   Peds  Hematology negative hematology ROS (+)   Anesthesia Other Findings Past Medical History: No date: Arthritis No date: Diabetes mellitus without complication ([place]) 66[birth year]/43: Gall stones, common bile duct     Comment:  Gallbladder infection  No date: Hypercholesteremia No date: Hypertension 1994: Pneumonia No date: Tremors of nervous system Past Surgical History: No date: APPENDECTOMY 04/02/2017: CHOLECYSTECTOMY; N/A     Comment:  Procedure: LAPAROSCOPIC CHOLECYSTECTOMY WITH               INTRAOPERATIVE CHOLANGIOGRAM;  Surgeon: Jules Husbands,               MD;  Location: [facility] ORS;  Service: General;  Laterality:               N/A; 03/04/2017: ERCP; N/A     Comment:  Procedure: ENDOSCOPIC RETROGRADE               CHOLANGIOPANCREATOGRAPHY (ERCP);  Surgeon: Lucilla Lame,               MD;  Location: [facility] ENDOSCOPY;  Service: Endoscopy;                Laterality: N/A; 04/07/2017: ERCP; N/A     Comment:  Procedure: ENDOSCOPIC RETROGRADE               CHOLANGIOPANCREATOGRAPHY (ERCP);  Surgeon: Lucilla Lame,            MD;  Location: [facility] ENDOSCOPY;  Service: Endoscopy;                Laterality: N/A; No date: HERNIA REPAIR 01/18/2017: IR GUIDED DRAIN W CATHETER PLACEMENT BMI    Body Mass Index:  22.05 kg/m     Reproductive/Obstetrics negative OB ROS                             Anesthesia Physical  Anesthesia Plan  ASA: III  Anesthesia Plan: MAC   Post-op Pain Management:    Induction: Intravenous  PONV Risk Score and Plan:   Airway Management Planned: Nasal Cannula  Additional Equipment:   Intra-op Plan:   Post-operative Plan:   Informed Consent: I have reviewed the patients History and Physical, chart, labs and discussed the procedure including the risks, benefits and alternatives for the proposed anesthesia with the patient or authorized representative who has indicated his/her understanding and acceptance.   Dental Advisory Given  Plan Discussed with: CRNA  Anesthesia Plan Comments:         Anesthesia Quick Evaluation

## 2018-05-13 ENCOUNTER — Encounter: Payer: Self-pay | Admitting: Ophthalmology

## 2018-05-18 ENCOUNTER — Other Ambulatory Visit: Payer: Self-pay

## 2018-05-18 ENCOUNTER — Other Ambulatory Visit: Payer: Medicare HMO

## 2018-05-18 ENCOUNTER — Inpatient Hospital Stay: Payer: Medicare HMO | Attending: Internal Medicine | Admitting: Internal Medicine

## 2018-05-18 VITALS — BP 134/71 | HR 68 | Temp 97.6°F | Resp 18 | Ht 68.0 in | Wt 160.0 lb

## 2018-05-18 DIAGNOSIS — E782 Mixed hyperlipidemia: Secondary | ICD-10-CM | POA: Diagnosis not present

## 2018-05-18 DIAGNOSIS — D509 Iron deficiency anemia, unspecified: Secondary | ICD-10-CM

## 2018-05-18 DIAGNOSIS — D649 Anemia, unspecified: Secondary | ICD-10-CM

## 2018-05-18 DIAGNOSIS — Z862 Personal history of diseases of the blood and blood-forming organs and certain disorders involving the immune mechanism: Secondary | ICD-10-CM | POA: Diagnosis not present

## 2018-05-18 DIAGNOSIS — Z87891 Personal history of nicotine dependence: Secondary | ICD-10-CM

## 2018-05-18 DIAGNOSIS — E538 Deficiency of other specified B group vitamins: Secondary | ICD-10-CM | POA: Diagnosis not present

## 2018-05-18 DIAGNOSIS — E119 Type 2 diabetes mellitus without complications: Secondary | ICD-10-CM | POA: Diagnosis not present

## 2018-05-18 DIAGNOSIS — D508 Other iron deficiency anemias: Secondary | ICD-10-CM | POA: Diagnosis not present

## 2018-05-18 NOTE — Progress Notes (Signed)
[facility] NOTE  Patient Care Team: Dion Body, MD as PCP - General (Family Medicine) Manya Silvas, MD (Gastroenterology)  CHIEF COMPLAINTS/PURPOSE OF CONSULTATION:  Anemia  # Iron deficiency anemia- iron deficiency [June 2019-PCP; Dr.Linthavong office-ferritin-10; iron 17]; colonoscopy in 2010 with Dr. Vira Agar; NO-EGD. Re-eval aug 2019- NO egd/colo  #History of B12 deficiency-monthly B12 injections [chronic]; 2018 November-acute cholecystitis/cholecystectomy-ERCP extraction.   No history exists.     HISTORY OF PRESENTING ILLNESS:  Benjamin Austin 84 y.o.  male iron deficiency anemia on p.o. iron is here for follow-up.  The interim patient had cataract extraction his right side.  He is awaiting to have extraction of his left side.  Significant improvement in his vision.  His appetite is good.  Denies any blood in stools or black loose stools.  Is currently taking 1 iron pill a day.  Review of Systems  Constitutional: Negative for chills, diaphoresis, fever and weight loss.  HENT: Negative for nosebleeds and sore throat.   Eyes: Negative for double vision.  Respiratory: Negative for cough, hemoptysis, sputum production, shortness of breath and wheezing.   Cardiovascular: Negative for chest pain, palpitations, orthopnea and leg swelling.  Gastrointestinal: Negative for abdominal pain, blood in stool, constipation, diarrhea, heartburn, melena, nausea and vomiting.  Genitourinary: Negative for dysuria, frequency and urgency.  Musculoskeletal: Negative for back pain and joint pain.  Skin: Negative.  Negative for itching and rash.  Neurological: Negative for dizziness, tingling, focal weakness, weakness and headaches.  Endo/Heme/Allergies: Does not bruise/bleed easily.  Psychiatric/Behavioral: Negative for depression. The patient is not nervous/anxious and does not have insomnia.      MEDICAL HISTORY:  Past Medical History:  Diagnosis Date   . Anginal pain ([facility])   . Arthritis   . B12 deficiency   . Diabetes mellitus without complication ([place])   . Gall stones, common bile duct 01/18/2017   Gallbladder infection   . GERD without esophagitis   . Heart murmur   . HOH (hard of hearing)   . Hypercholesteremia   . Hypertension   . Normochromic anemia   . Pneumonia 1994  . Tremors of nervous system     SURGICAL HISTORY: Past Surgical History:  Procedure Laterality Date  . APPENDECTOMY    . CATARACT EXTRACTION W/PHACO Right 05/12/2018   Procedure: CATARACT EXTRACTION PHACO AND INTRAOCULAR LENS PLACEMENT ([place]);  Surgeon: Birder Robson, MD;  Location: [facility] ORS;  Service: Ophthalmology;  Laterality: Right;  [place]  00:43 CDE 4.74 Fluid pack lot # [id number]  . CHOLECYSTECTOMY N/A 04/02/2017   Procedure: LAPAROSCOPIC CHOLECYSTECTOMY WITH INTRAOPERATIVE CHOLANGIOGRAM;  Surgeon: Jules Husbands, MD;  Location: [facility] ORS;  Service: General;  Laterality: N/A;  . COLONOSCOPY  12/16/2008  . ERCP N/A 03/04/2017   Procedure: ENDOSCOPIC RETROGRADE CHOLANGIOPANCREATOGRAPHY (ERCP);  Surgeon: Lucilla Lame, MD;  Location: [facility] ENDOSCOPY;  Service: Endoscopy;  Laterality: N/A;  . ERCP N/A 04/07/2017   Procedure: ENDOSCOPIC RETROGRADE CHOLANGIOPANCREATOGRAPHY (ERCP);  Surgeon: Lucilla Lame, MD;  Location: [facility] ENDOSCOPY;  Service: Endoscopy;  Laterality: N/A;  . ERCP N/A 06/06/2017   Procedure: ENDOSCOPIC RETROGRADE CHOLANGIOPANCREATOGRAPHY (ERCP);  Surgeon: Lucilla Lame, MD;  Location: [facility] ENDOSCOPY;  Service: Endoscopy;  Laterality: N/A;  . HERNIA REPAIR    . IR GUIDED DRAIN W CATHETER PLACEMENT  01/18/2017  . PROSTATE BIOPSY     x 2; benign    SOCIAL HISTORY: Patient is a retired [employer]  Remote history of smoking/chews cigars.  No alcohol.  Patient is fairly independent  of his daily living; lives with his wife at home. Social History   Socioeconomic History  . Marital status: Married    Spouse name: Not on file  . Number of  children: Not on file  . Years of education: Not on file  . Highest education level: Not on file  Occupational History  . Not on file  Social Needs  . Financial resource strain: Not on file  . Food insecurity:    Worry: Not on file    Inability: Not on file  . Transportation needs:    Medical: Not on file    Non-medical: Not on file  Tobacco Use  . Smoking status: Former Smoker    Types: Cigars  . Smokeless tobacco: Never Used  Substance and Sexual Activity  . Alcohol use: No  . Drug use: No  . Sexual activity: Not on file  Lifestyle  . Physical activity:    Days per week: Not on file    Minutes per session: Not on file  . Stress: Not on file  Relationships  . Social connections:    Talks on phone: Not on file    Gets together: Not on file    Attends religious service: Not on file    Active member of club or organization: Not on file    Attends meetings of clubs or organizations: Not on file    Relationship status: Not on file  . Intimate partner violence:    Fear of current or ex partner: Not on file    Emotionally abused: Not on file    Physically abused: Not on file    Forced sexual activity: Not on file  Other Topics Concern  . Not on file  Social History Narrative  . Not on file    FAMILY HISTORY: Family History  Problem Relation Age of Onset  . Heart disease Father     ALLERGIES:  has No Known Allergies.  MEDICATIONS:  Current Outpatient Medications  Medication Sig Dispense Refill  . Calcium Carbonate-Vitamin D 600-400 MG-UNIT tablet Take 1 tablet by mouth daily.    . cyanocobalamin (,VITAMIN B-12,) 1000 MCG/ML injection Inject 1,000 mcg into the muscle every 30 (thirty) days.     . ferrous sulfate 325 (65 FE) MG tablet Take 1 tablet (325 mg total) 2 (two) times daily with a meal by mouth. (Patient taking differently: Take 325 mg by mouth. ) 60 tablet 1  . lisinopril (PRINIVIL,ZESTRIL) 20 MG tablet Take 20 mg by mouth daily.    Marland Kitchen loratadine (CLARITIN)  10 MG tablet Take 10 mg by mouth daily as needed for allergies.    . metFORMIN (GLUCOPHAGE) 1000 MG tablet Take 1,000 mg by mouth 2 (two) times daily.    . Omega-3 Fatty Acids (FISH OIL) 1200 MG CAPS Take 1,200 mg by mouth daily with breakfast.    . Polyethyl Glycol-Propyl Glycol (SYSTANE) 0.4-0.3 % GEL ophthalmic gel Place 1 application into both eyes.    . pravastatin (PRAVACHOL) 20 MG tablet Take 10 mg by mouth at bedtime.    . docusate sodium (COLACE) 100 MG capsule Take 100 mg by mouth daily as needed (constipation).     No current facility-administered medications for this visit.       Marland Kitchen  PHYSICAL EXAMINATION: ECOG PERFORMANCE STATUS: 1 - Symptomatic but completely ambulatory  Vitals:   05/18/18 1045  BP: 134/71  Pulse: 68  Resp: 18  Temp: 97.6 F (36.4 C)   Filed Weights   05/18/18  1052  Weight: 160 lb (72.6 kg)    Physical Exam  Constitutional: He is oriented to person, place, and time and well-developed, well-nourished, and in no distress.  HENT:  Head: Normocephalic and atraumatic.  Mouth/Throat: Oropharynx is clear and moist. No oropharyngeal exudate.  Eyes: Pupils are equal, round, and reactive to light.  Neck: Normal range of motion. Neck supple.  Cardiovascular: Normal rate and regular rhythm.  Pulmonary/Chest: No respiratory distress. He has no wheezes.  Abdominal: Soft. Bowel sounds are normal. He exhibits no distension and no mass. There is no tenderness. There is no rebound and no guarding.  Musculoskeletal: Normal range of motion. He exhibits no edema or tenderness.  Neurological: He is alert and oriented to person, place, and time.  Skin: Skin is warm.  Psychiatric: Affect normal.     LABORATORY DATA:  I have reviewed the data as listed Lab Results  Component Value Date   WBC 7.8 01/12/2018   HGB 13.2 01/12/2018   HCT 40.5 01/12/2018   MCV 87.1 01/12/2018   PLT 213 01/12/2018   Recent Labs    01/12/18 1037  NA 140  K 4.8  CL 107  CO2  24  GLUCOSE 159*  BUN 19  CREATININE 1.21  CALCIUM 9.7  GFRNONAA 53*  GFRAA >60  PROT 8.1  ALBUMIN 3.9  AST 37  ALT 39  ALKPHOS 174*  BILITOT 0.2*    RADIOGRAPHIC STUDIES: I have personally reviewed the radiological images as listed and agreed with the findings in the report. No results found.  ASSESSMENT & PLAN:   Normochromic normocytic anemia #Iron deficiency anemia-unclear etiology.  Hemoglobin currently 13 on p.o. iron once a day.  Tolerating well.  If in having issues recommend taking every other day.  #Etiology of iron deficiency unclear-evaluated by GI in August 2019.  Since hemoglobin is improving/patient reluctant to have any sedated procedures-recommend holding off any procedure at this time.  #Discussed regarding follow-up in 6 months patient declines.  Continue follow-up with PCP if worse follow-up with [place].  # DISPOSITION:  #Recommend follow up as needed [reluctant for follow up]  Cc; Dr.L    All questions were answered. The patient knows to call the clinic with any problems, questions or concerns.       Cammie Sickle, MD 05/18/2018 11:13 AM

## 2018-05-18 NOTE — Assessment & Plan Note (Addendum)
#  Iron deficiency anemia-unclear etiology.  Hemoglobin currently 13 on p.o. iron once a day.  Tolerating well.  If in having issues recommend taking every other day.  #Etiology of iron deficiency unclear-evaluated by GI in August 2019.  Since hemoglobin is improving/patient reluctant to have any sedated procedures-recommend holding off any procedure at this time.  #Discussed regarding follow-up in 6 months patient declines.  Continue follow-up with PCP if worse follow-up with [place].  # DISPOSITION:  #Recommend follow up as needed [reluctant for follow up]  Cc; Dr.L

## 2018-05-25 DIAGNOSIS — D508 Other iron deficiency anemias: Secondary | ICD-10-CM | POA: Diagnosis not present

## 2018-05-25 DIAGNOSIS — E1169 Type 2 diabetes mellitus with other specified complication: Secondary | ICD-10-CM | POA: Diagnosis not present

## 2018-05-25 DIAGNOSIS — E782 Mixed hyperlipidemia: Secondary | ICD-10-CM | POA: Diagnosis not present

## 2018-05-25 DIAGNOSIS — Z0000 Encounter for general adult medical examination without abnormal findings: Secondary | ICD-10-CM | POA: Diagnosis not present

## 2018-05-25 DIAGNOSIS — N183 Chronic kidney disease, stage 3 (moderate): Secondary | ICD-10-CM | POA: Diagnosis not present

## 2018-05-25 DIAGNOSIS — E785 Hyperlipidemia, unspecified: Secondary | ICD-10-CM | POA: Diagnosis not present

## 2018-06-01 DIAGNOSIS — E538 Deficiency of other specified B group vitamins: Secondary | ICD-10-CM | POA: Diagnosis not present

## 2018-06-01 DIAGNOSIS — H2512 Age-related nuclear cataract, left eye: Secondary | ICD-10-CM | POA: Diagnosis not present

## 2018-06-04 ENCOUNTER — Encounter: Payer: Self-pay | Admitting: *Deleted

## 2018-06-16 ENCOUNTER — Encounter
Admission: RE | Disposition: A | Discharge status: Home / Self Care | Payer: Self-pay | Source: Home / Self Care | Attending: Ophthalmology

## 2018-06-16 ENCOUNTER — Ambulatory Visit
Admission: RE | Admit: 2018-06-16 | Discharge: 2018-06-16 | Disposition: A | Discharge status: Home / Self Care | Payer: Medicare HMO | Attending: Ophthalmology | Admitting: Ophthalmology

## 2018-06-16 ENCOUNTER — Ambulatory Visit: Payer: Medicare HMO | Admitting: Certified Registered Nurse Anesthetist

## 2018-06-16 ENCOUNTER — Other Ambulatory Visit: Payer: Self-pay

## 2018-06-16 ENCOUNTER — Encounter: Payer: Self-pay | Admitting: *Deleted

## 2018-06-16 DIAGNOSIS — K219 Gastro-esophageal reflux disease without esophagitis: Secondary | ICD-10-CM | POA: Diagnosis not present

## 2018-06-16 DIAGNOSIS — E538 Deficiency of other specified B group vitamins: Secondary | ICD-10-CM | POA: Diagnosis not present

## 2018-06-16 DIAGNOSIS — Z79899 Other long term (current) drug therapy: Secondary | ICD-10-CM | POA: Insufficient documentation

## 2018-06-16 DIAGNOSIS — Z7984 Long term (current) use of oral hypoglycemic drugs: Secondary | ICD-10-CM | POA: Diagnosis not present

## 2018-06-16 DIAGNOSIS — E7800 Pure hypercholesterolemia, unspecified: Secondary | ICD-10-CM | POA: Diagnosis not present

## 2018-06-16 DIAGNOSIS — E782 Mixed hyperlipidemia: Secondary | ICD-10-CM | POA: Diagnosis not present

## 2018-06-16 DIAGNOSIS — H9190 Unspecified hearing loss, unspecified ear: Secondary | ICD-10-CM | POA: Insufficient documentation

## 2018-06-16 DIAGNOSIS — E1136 Type 2 diabetes mellitus with diabetic cataract: Secondary | ICD-10-CM | POA: Insufficient documentation

## 2018-06-16 DIAGNOSIS — Z87891 Personal history of nicotine dependence: Secondary | ICD-10-CM | POA: Insufficient documentation

## 2018-06-16 DIAGNOSIS — I10 Essential (primary) hypertension: Secondary | ICD-10-CM | POA: Diagnosis not present

## 2018-06-16 DIAGNOSIS — D649 Anemia, unspecified: Secondary | ICD-10-CM | POA: Insufficient documentation

## 2018-06-16 DIAGNOSIS — H2512 Age-related nuclear cataract, left eye: Secondary | ICD-10-CM | POA: Diagnosis not present

## 2018-06-16 DIAGNOSIS — E119 Type 2 diabetes mellitus without complications: Secondary | ICD-10-CM | POA: Diagnosis not present

## 2018-06-16 HISTORY — PX: CATARACT EXTRACTION W/PHACO: SHX586

## 2018-06-16 LAB — GLUCOSE, CAPILLARY: Glucose-Capillary: 133 mg/dL — ABNORMAL HIGH (ref 70–99)

## 2018-06-16 SURGERY — PHACOEMULSIFICATION, CATARACT, WITH IOL INSERTION
Anesthesia: Topical | Site: Eye | Laterality: Left

## 2018-06-16 MED ORDER — POVIDONE-IODINE 5 % OP SOLN
OPHTHALMIC | Status: DC | PRN
  Administered 2018-06-16: 1 via OPHTHALMIC

## 2018-06-16 MED ORDER — EPINEPHRINE PF 1 MG/ML IJ SOLN
INTRAMUSCULAR | Status: AC
  Filled 2018-06-16: qty 2

## 2018-06-16 MED ORDER — ARMC OPHTHALMIC DILATING DROPS
OPHTHALMIC | Status: AC
  Filled 2018-06-16: qty 0.5

## 2018-06-16 MED ORDER — SODIUM CHLORIDE 0.9 % IV SOLN
INTRAVENOUS | Status: DC
  Administered 2018-06-16: 07:00:00 via INTRAVENOUS

## 2018-06-16 MED ORDER — EPINEPHRINE PF 1 MG/ML IJ SOLN
INTRAOCULAR | Status: DC | PRN
  Administered 2018-06-16: 08:00:00 via OPHTHALMIC

## 2018-06-16 MED ORDER — TETRACAINE HCL 0.5 % OP SOLN
1.0000 [drp] | OPHTHALMIC | Status: AC | PRN
  Administered 2018-06-16 (×3): 1 [drp] via OPHTHALMIC

## 2018-06-16 MED ORDER — FENTANYL CITRATE (PF) 100 MCG/2ML IJ SOLN: 25.0000 ug | INTRAMUSCULAR | Status: DC | PRN

## 2018-06-16 MED ORDER — FENTANYL CITRATE (PF) 100 MCG/2ML IJ SOLN
INTRAMUSCULAR | Status: AC
  Filled 2018-06-16: qty 2

## 2018-06-16 MED ORDER — TETRACAINE HCL 0.5 % OP SOLN
OPHTHALMIC | Status: AC
  Filled 2018-06-16: qty 4

## 2018-06-16 MED ORDER — FENTANYL CITRATE (PF) 100 MCG/2ML IJ SOLN
INTRAMUSCULAR | Status: DC | PRN
  Administered 2018-06-16: 50 ug via INTRAVENOUS
  Administered 2018-06-16 (×2): 25 ug via INTRAVENOUS

## 2018-06-16 MED ORDER — LIDOCAINE HCL (PF) 4 % IJ SOLN
INTRAMUSCULAR | Status: AC
  Filled 2018-06-16: qty 5

## 2018-06-16 MED ORDER — MOXIFLOXACIN HCL 0.5 % OP SOLN: 1.0000 [drp] | OPHTHALMIC | Status: DC | PRN

## 2018-06-16 MED ORDER — ONDANSETRON HCL 4 MG/2ML IJ SOLN: 4.0000 mg | Freq: Once | INTRAMUSCULAR | Status: DC | PRN

## 2018-06-16 MED ORDER — POVIDONE-IODINE 5 % OP SOLN
OPHTHALMIC | Status: AC
  Filled 2018-06-16: qty 30

## 2018-06-16 MED ORDER — NA CHONDROIT SULF-NA HYALURON 40-17 MG/ML IO SOLN
INTRAOCULAR | Status: AC
  Filled 2018-06-16: qty 1

## 2018-06-16 MED ORDER — ARMC OPHTHALMIC DILATING DROPS
1.0000 "application " | OPHTHALMIC | Status: AC
  Administered 2018-06-16 (×3): 1 via OPHTHALMIC

## 2018-06-16 MED ORDER — MOXIFLOXACIN HCL 0.5 % OP SOLN
OPHTHALMIC | Status: AC
  Filled 2018-06-16: qty 3

## 2018-06-16 MED ORDER — LIDOCAINE HCL (PF) 4 % IJ SOLN
INTRAOCULAR | Status: DC | PRN
  Administered 2018-06-16: 4 mL via OPHTHALMIC

## 2018-06-16 MED ORDER — CARBACHOL 0.01 % IO SOLN
INTRAOCULAR | Status: DC | PRN
  Administered 2018-06-16: 0.5 mL via INTRAOCULAR

## 2018-06-16 MED ORDER — NA CHONDROIT SULF-NA HYALURON 40-17 MG/ML IO SOLN
INTRAOCULAR | Status: DC | PRN
  Administered 2018-06-16: 1 mL via INTRAOCULAR

## 2018-06-16 MED ORDER — MOXIFLOXACIN HCL 0.5 % OP SOLN
OPHTHALMIC | Status: DC | PRN
  Administered 2018-06-16: 0.2 mL via OPHTHALMIC

## 2018-06-16 SURGICAL SUPPLY — 16 items
GLOVE BIO SURGEON STRL SZ8 (GLOVE) ×3 IMPLANT
GLOVE BIOGEL M 6.5 STRL (GLOVE) ×3 IMPLANT
GLOVE SURG LX 8.0 MICRO (GLOVE) ×2
GLOVE SURG LX STRL 8.0 MICRO (GLOVE) ×1 IMPLANT
GOWN STRL REUS W/ TWL LRG LVL3 (GOWN DISPOSABLE) ×2 IMPLANT
GOWN STRL REUS W/TWL LRG LVL3 (GOWN DISPOSABLE) ×4
LABEL CATARACT MEDS ST (LABEL) ×3 IMPLANT
LENS IOL TECNIS ITEC 21.5 (Intraocular Lens) ×2 IMPLANT
PACK CATARACT (MISCELLANEOUS) ×3 IMPLANT
PACK CATARACT BRASINGTON LX (MISCELLANEOUS) ×3 IMPLANT
PACK EYE AFTER SURG (MISCELLANEOUS) ×3 IMPLANT
SOL BSS BAG (MISCELLANEOUS) ×3
SOLUTION BSS BAG (MISCELLANEOUS) ×1 IMPLANT
SYR 5ML LL (SYRINGE) ×3 IMPLANT
WATER STERILE IRR 250ML POUR (IV SOLUTION) ×3 IMPLANT
WIPE NON LINTING 3.25X3.25 (MISCELLANEOUS) ×3 IMPLANT

## 2018-06-16 NOTE — Anesthesia Procedure Notes (Signed)
Procedure Name: MAC Date/Time: 06/16/2018 8:09 AM Performed by: Johnna Acosta, CRNA Pre-anesthesia Checklist: Patient identified, Emergency Drugs available, Suction available, Patient being monitored and Timeout performed Patient Re-evaluated:Patient Re-evaluated prior to induction Oxygen Delivery Method: Nasal cannula Preoxygenation: Pre-oxygenation with 100% oxygen

## 2018-06-16 NOTE — Anesthesia Post-op Follow-up Note (Signed)
Anesthesia QCDR form completed.        

## 2018-06-16 NOTE — Anesthesia Postprocedure Evaluation (Signed)
Anesthesia Post Note  Patient: Benjamin Austin  Procedure(s) Performed: CATARACT EXTRACTION PHACO AND INTRAOCULAR LENS PLACEMENT (IOC) LEFT DIABETIC (Left Eye)  Patient location during evaluation: PACU Anesthesia Type: MAC Level of consciousness: awake and alert and oriented Pain management: pain level controlled Vital Signs Assessment: post-procedure vital signs reviewed and stable Respiratory status: spontaneous breathing, nonlabored ventilation and respiratory function stable Cardiovascular status: blood pressure returned to baseline and stable Postop Assessment: no signs of nausea or vomiting Anesthetic complications: no     Last Vitals:  Vitals:   06/16/18 0647  BP: 125/68  Pulse: 71  Resp: 18  Temp: 36.5 C  SpO2: 95%    Last Pain:  Vitals:   06/16/18 0647  PainSc: 0-No pain                 Rossy Virag

## 2018-06-16 NOTE — Transfer of Care (Signed)
Immediate Anesthesia Transfer of Care Note  Patient: Benjamin Austin  Procedure(s) Performed: CATARACT EXTRACTION PHACO AND INTRAOCULAR LENS PLACEMENT ([facility]) LEFT DIABETIC (Left Eye)  Patient Location: PACU  Anesthesia Type:MAC  Level of Consciousness: awake, alert  and oriented  Airway & Oxygen Therapy: Patient Spontanous Breathing  Post-op Assessment: Report given to RN and Post -op Vital signs reviewed and stable  Post vital signs: Reviewed and stable  Last Vitals:  Vitals Value Taken Time  BP    Temp    Pulse    Resp    SpO2      Last Pain:  Vitals:   06/16/18 0647  PainSc: 0-No pain         Complications: No apparent anesthesia complications

## 2018-06-16 NOTE — Discharge Instructions (Signed)
Eye Surgery Discharge Instructions    Expect mild scratchy sensation or mild soreness. DO NOT RUB YOUR EYE!  The day of surgery:  Minimal physical activity, but bed rest is not required  No reading, computer work, or close hand work  No bending, lifting, or straining.  May watch TV  For 24 hours:  No driving, legal decisions, or alcoholic beverages  Safety precautions  Eat anything you prefer: It is better to start with liquids, then soup then solid foods.  _____ Eye patch should be worn until postoperative exam tomorrow.  ____ Solar shield eyeglasses should be worn for comfort in the sunlight/patch while sleeping  Resume all regular medications including aspirin or Coumadin if these were discontinued prior to surgery. You may shower, bathe, shave, or wash your hair. Tylenol may be taken for mild discomfort.  Call your doctor if you experience significant pain, nausea, or vomiting, fever > 101 or other signs of infection. [phone] or [phone] Specific instructions:  Follow-up Information    Birder Robson, MD Follow up on 06/17/2018.   Specialty:  Ophthalmology Why:  10:25 Contact information: [address] [place] [address] [phone]

## 2018-06-16 NOTE — Op Note (Signed)
PREOPERATIVE DIAGNOSIS:  Nuclear sclerotic cataract of the left eye.   POSTOPERATIVE DIAGNOSIS:  Nuclear sclerotic cataract of the left eye.   OPERATIVE PROCEDURE: Procedure(s): CATARACT EXTRACTION PHACO AND INTRAOCULAR LENS PLACEMENT ([facility]) LEFT DIABETIC   SURGEON:  Birder Robson, MD.   ANESTHESIA:  Anesthesiologist: Emmie Niemann, MD CRNA: Johnna Acosta, CRNA; Nelda Marseille, CRNA  1.      Managed anesthesia care. 2.     0.30ml of Shugarcaine was instilled following the paracentesis   COMPLICATIONS:  None.   TECHNIQUE:   Stop and chop   DESCRIPTION OF PROCEDURE:  The patient was examined and consented in the preoperative holding area where the aforementioned topical anesthesia was applied to the left eye and then brought back to the Operating Room where the left eye was prepped and draped in the usual sterile ophthalmic fashion and a lid speculum was placed. A paracentesis was created with the side port blade and the anterior chamber was filled with viscoelastic. A near clear corneal incision was performed with the steel keratome. A continuous curvilinear capsulorrhexis was performed with a cystotome followed by the capsulorrhexis forceps. Hydrodissection and hydrodelineation were carried out with BSS on a blunt cannula. The lens was removed in a stop and chop  technique and the remaining cortical material was removed with the irrigation-aspiration handpiece. The capsular bag was inflated with viscoelastic and the Technis [id number] lens was placed in the capsular bag without complication. The remaining viscoelastic was removed from the eye with the irrigation-aspiration handpiece. The wounds were hydrated. The anterior chamber was flushed with Miostat and the eye was inflated to physiologic pressure. 0.64ml Vigamox was placed in the anterior chamber. The wounds were found to be water tight. The eye was dressed with Vigamox. The patient was given protective glasses to wear throughout the day  and a shield with which to sleep tonight. The patient was also given drops with which to begin a drop regimen today and will follow-up with me in one day. Implant Name Type Inv. Item Serial No. Manufacturer Lot No. LRB No. Used  LENS IOL DIOP 21.5 - [id number] Intraocular Lens LENS IOL DIOP 21.5 [id number]  Left 1    Procedure(s) with comments: CATARACT EXTRACTION PHACO AND INTRAOCULAR LENS PLACEMENT (IOC) LEFT DIABETIC (Left) - [place] 00:46.5 CDE 7.10 Fluid Pack Lot # [id number]  Electronically signed: Birder Robson 06/16/2018 8:28 AM

## 2018-06-16 NOTE — H&P (Signed)
All labs reviewed. Abnormal studies sent to patients PCP when indicated.  Previous H&P reviewed, patient examined, there are NO CHANGES.  Benjamin Carlo Porfilio1/7/20208:00 AM

## 2018-06-16 NOTE — Anesthesia Preprocedure Evaluation (Signed)
Anesthesia Evaluation  Patient identified by MRN, date of birth, ID band Patient awake    Reviewed: Allergy & Precautions, NPO status , Patient's Chart, lab work & pertinent test results  History of Anesthesia Complications Negative for: history of anesthetic complications  Airway Mallampati: II  TM Distance: >3 FB Neck ROM: Full    Dental no notable dental hx.    Pulmonary neg sleep apnea, neg COPD, former smoker,    breath sounds clear to auscultation- rhonchi (-) wheezing      Cardiovascular Exercise Tolerance: Good hypertension, Pt. on medications (-) CAD, (-) Past MI, (-) Cardiac Stents and (-) CABG  Rhythm:Regular Rate:Normal - Systolic murmurs and - Diastolic murmurs    Neuro/Psych neg Seizures negative neurological ROS  negative psych ROS   GI/Hepatic Neg liver ROS, GERD  ,  Endo/Other  diabetes, Oral Hypoglycemic Agents  Renal/GU negative Renal ROS     Musculoskeletal  (+) Arthritis ,   Abdominal (+) - obese,   Peds  Hematology  (+) anemia ,   Anesthesia Other Findings Past Medical History: No date: Anginal pain ([facility]) No date: Arthritis No date: B12 deficiency No date: Diabetes mellitus without complication ([place]) 24/40/1027: Gall stones, common bile duct     Comment:  Gallbladder infection  No date: GERD without esophagitis No date: Heart murmur No date: HOH (hard of hearing) No date: Hypercholesteremia No date: Hypertension No date: Normochromic anemia 1994: Pneumonia No date: Tremors of nervous system   Reproductive/Obstetrics                             Anesthesia Physical Anesthesia Plan  ASA: III  Anesthesia Plan: MAC   Post-op Pain Management:    Induction: Intravenous  PONV Risk Score and Plan: 1 and Midazolam  Airway Management Planned: Natural Airway  Additional Equipment:   Intra-op Plan:   Post-operative Plan:   Informed Consent: I have  reviewed the patients History and Physical, chart, labs and discussed the procedure including the risks, benefits and alternatives for the proposed anesthesia with the patient or authorized representative who has indicated his/her understanding and acceptance.     Plan Discussed with: CRNA and Anesthesiologist  Anesthesia Plan Comments:         Anesthesia Quick Evaluation

## 2018-06-17 ENCOUNTER — Encounter: Payer: Self-pay | Admitting: Ophthalmology

## 2018-07-02 DIAGNOSIS — E538 Deficiency of other specified B group vitamins: Secondary | ICD-10-CM | POA: Diagnosis not present

## 2018-07-05 DIAGNOSIS — J0110 Acute frontal sinusitis, unspecified: Secondary | ICD-10-CM | POA: Diagnosis not present

## 2018-07-05 DIAGNOSIS — J40 Bronchitis, not specified as acute or chronic: Secondary | ICD-10-CM | POA: Diagnosis not present

## 2018-07-16 DIAGNOSIS — Z961 Presence of intraocular lens: Secondary | ICD-10-CM | POA: Diagnosis not present

## 2018-08-03 DIAGNOSIS — E538 Deficiency of other specified B group vitamins: Secondary | ICD-10-CM | POA: Diagnosis not present

## 2018-09-03 DIAGNOSIS — E538 Deficiency of other specified B group vitamins: Secondary | ICD-10-CM | POA: Diagnosis not present

## 2018-09-22 DIAGNOSIS — N183 Chronic kidney disease, stage 3 (moderate): Secondary | ICD-10-CM | POA: Diagnosis not present

## 2018-09-22 DIAGNOSIS — E785 Hyperlipidemia, unspecified: Secondary | ICD-10-CM | POA: Diagnosis not present

## 2018-09-22 DIAGNOSIS — D508 Other iron deficiency anemias: Secondary | ICD-10-CM | POA: Diagnosis not present

## 2018-09-22 DIAGNOSIS — E782 Mixed hyperlipidemia: Secondary | ICD-10-CM | POA: Diagnosis not present

## 2018-09-22 DIAGNOSIS — E1169 Type 2 diabetes mellitus with other specified complication: Secondary | ICD-10-CM | POA: Diagnosis not present

## 2018-09-29 DIAGNOSIS — D508 Other iron deficiency anemias: Secondary | ICD-10-CM | POA: Diagnosis not present

## 2018-09-29 DIAGNOSIS — E782 Mixed hyperlipidemia: Secondary | ICD-10-CM | POA: Diagnosis not present

## 2018-09-29 DIAGNOSIS — E538 Deficiency of other specified B group vitamins: Secondary | ICD-10-CM | POA: Diagnosis not present

## 2018-09-29 DIAGNOSIS — N183 Chronic kidney disease, stage 3 (moderate): Secondary | ICD-10-CM | POA: Diagnosis not present

## 2018-09-29 DIAGNOSIS — E785 Hyperlipidemia, unspecified: Secondary | ICD-10-CM | POA: Diagnosis not present

## 2018-09-29 DIAGNOSIS — E1169 Type 2 diabetes mellitus with other specified complication: Secondary | ICD-10-CM | POA: Diagnosis not present

## 2018-10-05 DIAGNOSIS — E538 Deficiency of other specified B group vitamins: Secondary | ICD-10-CM | POA: Diagnosis not present

## 2018-11-05 DIAGNOSIS — E538 Deficiency of other specified B group vitamins: Secondary | ICD-10-CM | POA: Diagnosis not present

## 2018-12-07 DIAGNOSIS — E538 Deficiency of other specified B group vitamins: Secondary | ICD-10-CM | POA: Diagnosis not present

## 2018-12-30 ENCOUNTER — Other Ambulatory Visit: Payer: Self-pay | Admitting: Family Medicine

## 2018-12-30 DIAGNOSIS — Z20822 Contact with and (suspected) exposure to covid-19: Secondary | ICD-10-CM

## 2018-12-30 DIAGNOSIS — R6889 Other general symptoms and signs: Secondary | ICD-10-CM | POA: Diagnosis not present

## 2019-01-03 LAB — NOVEL CORONAVIRUS, NAA: SARS-CoV-2, NAA: NOT DETECTED

## 2019-01-08 DIAGNOSIS — E538 Deficiency of other specified B group vitamins: Secondary | ICD-10-CM | POA: Diagnosis not present

## 2019-01-13 DIAGNOSIS — E119 Type 2 diabetes mellitus without complications: Secondary | ICD-10-CM | POA: Diagnosis not present

## 2019-01-22 DIAGNOSIS — E1169 Type 2 diabetes mellitus with other specified complication: Secondary | ICD-10-CM | POA: Diagnosis not present

## 2019-01-22 DIAGNOSIS — N183 Chronic kidney disease, stage 3 (moderate): Secondary | ICD-10-CM | POA: Diagnosis not present

## 2019-01-22 DIAGNOSIS — E538 Deficiency of other specified B group vitamins: Secondary | ICD-10-CM | POA: Diagnosis not present

## 2019-01-22 DIAGNOSIS — D508 Other iron deficiency anemias: Secondary | ICD-10-CM | POA: Diagnosis not present

## 2019-01-22 DIAGNOSIS — E782 Mixed hyperlipidemia: Secondary | ICD-10-CM | POA: Diagnosis not present

## 2019-01-22 DIAGNOSIS — E785 Hyperlipidemia, unspecified: Secondary | ICD-10-CM | POA: Diagnosis not present

## 2019-01-29 DIAGNOSIS — R011 Cardiac murmur, unspecified: Secondary | ICD-10-CM | POA: Diagnosis not present

## 2019-01-29 DIAGNOSIS — E538 Deficiency of other specified B group vitamins: Secondary | ICD-10-CM | POA: Diagnosis not present

## 2019-01-29 DIAGNOSIS — E782 Mixed hyperlipidemia: Secondary | ICD-10-CM | POA: Diagnosis not present

## 2019-01-29 DIAGNOSIS — E1169 Type 2 diabetes mellitus with other specified complication: Secondary | ICD-10-CM | POA: Diagnosis not present

## 2019-01-29 DIAGNOSIS — D508 Other iron deficiency anemias: Secondary | ICD-10-CM | POA: Diagnosis not present

## 2019-01-29 DIAGNOSIS — N184 Chronic kidney disease, stage 4 (severe): Secondary | ICD-10-CM | POA: Diagnosis not present

## 2019-01-29 DIAGNOSIS — E785 Hyperlipidemia, unspecified: Secondary | ICD-10-CM | POA: Diagnosis not present

## 2019-02-08 DIAGNOSIS — E538 Deficiency of other specified B group vitamins: Secondary | ICD-10-CM | POA: Diagnosis not present

## 2019-02-10 DIAGNOSIS — R011 Cardiac murmur, unspecified: Secondary | ICD-10-CM | POA: Diagnosis not present

## 2019-02-24 DIAGNOSIS — I340 Nonrheumatic mitral (valve) insufficiency: Secondary | ICD-10-CM | POA: Diagnosis not present

## 2019-02-24 DIAGNOSIS — G250 Essential tremor: Secondary | ICD-10-CM | POA: Diagnosis not present

## 2019-02-24 DIAGNOSIS — N184 Chronic kidney disease, stage 4 (severe): Secondary | ICD-10-CM | POA: Diagnosis not present

## 2019-03-01 DIAGNOSIS — I129 Hypertensive chronic kidney disease with stage 1 through stage 4 chronic kidney disease, or unspecified chronic kidney disease: Secondary | ICD-10-CM | POA: Diagnosis not present

## 2019-03-01 DIAGNOSIS — E1122 Type 2 diabetes mellitus with diabetic chronic kidney disease: Secondary | ICD-10-CM | POA: Diagnosis not present

## 2019-03-01 DIAGNOSIS — N183 Chronic kidney disease, stage 3 (moderate): Secondary | ICD-10-CM | POA: Diagnosis not present

## 2019-03-10 ENCOUNTER — Other Ambulatory Visit: Payer: Self-pay | Admitting: Nephrology

## 2019-03-10 DIAGNOSIS — N183 Chronic kidney disease, stage 3 unspecified: Secondary | ICD-10-CM

## 2019-03-11 DIAGNOSIS — Z23 Encounter for immunization: Secondary | ICD-10-CM | POA: Diagnosis not present

## 2019-03-11 DIAGNOSIS — E538 Deficiency of other specified B group vitamins: Secondary | ICD-10-CM | POA: Diagnosis not present

## 2019-03-19 ENCOUNTER — Ambulatory Visit
Admission: RE | Admit: 2019-03-19 | Discharge: 2019-03-19 | Disposition: A | Discharge status: Home / Self Care | Payer: Medicare HMO | Source: Ambulatory Visit | Attending: Nephrology | Admitting: Nephrology

## 2019-03-19 ENCOUNTER — Other Ambulatory Visit: Payer: Self-pay

## 2019-03-19 DIAGNOSIS — N183 Chronic kidney disease, stage 3 unspecified: Secondary | ICD-10-CM | POA: Insufficient documentation

## 2019-04-12 DIAGNOSIS — E1122 Type 2 diabetes mellitus with diabetic chronic kidney disease: Secondary | ICD-10-CM | POA: Diagnosis not present

## 2019-04-12 DIAGNOSIS — N1832 Chronic kidney disease, stage 3b: Secondary | ICD-10-CM | POA: Diagnosis not present

## 2019-04-12 DIAGNOSIS — E538 Deficiency of other specified B group vitamins: Secondary | ICD-10-CM | POA: Diagnosis not present

## 2019-04-12 DIAGNOSIS — I129 Hypertensive chronic kidney disease with stage 1 through stage 4 chronic kidney disease, or unspecified chronic kidney disease: Secondary | ICD-10-CM | POA: Diagnosis not present

## 2019-05-09 IMAGING — CT CT CHEST W/O CM
2 of 3 series · 15 of 36 positions shown, 18 images · non-contrast
Comparison: 01/17/2017

CLINICAL DATA: Followup lung nodule

EXAM:
CT CHEST WITHOUT CONTRAST
TECHNIQUE: Multidetector CT imaging of the chest was performed following the
standard protocol without IV contrast.

[Series 2: thorax · axial · 0.73mm/px · z∈[-511,-257]mm · 12 of 149 slices shown, 15 images]
[im 11/149  mediastinal]
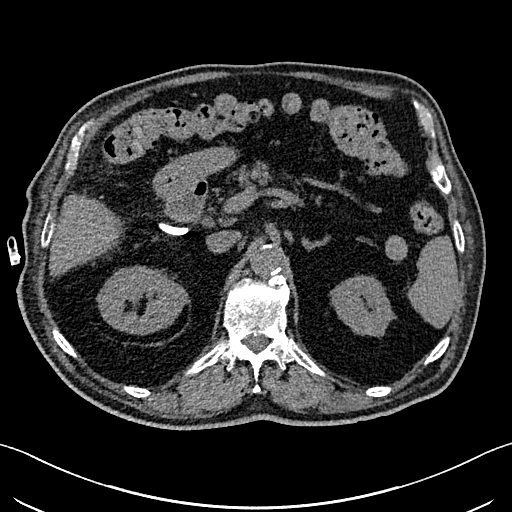
[im 11/149  lung]
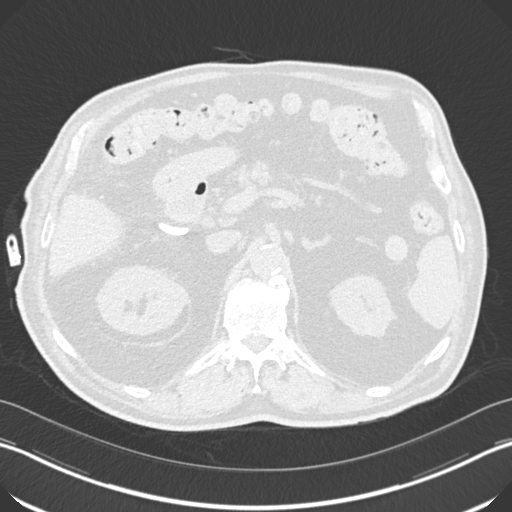
[im 22/149  lung]
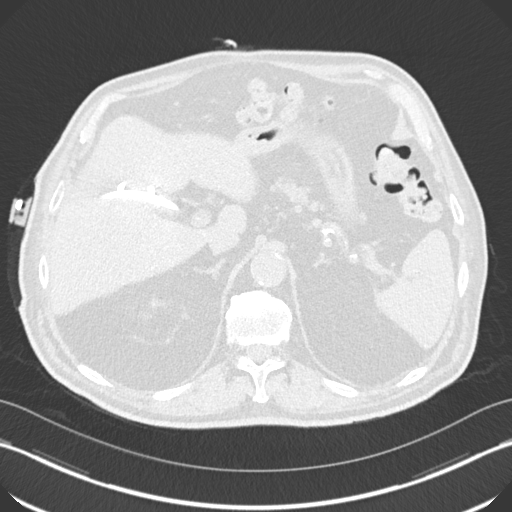
[im 33/149  lung]
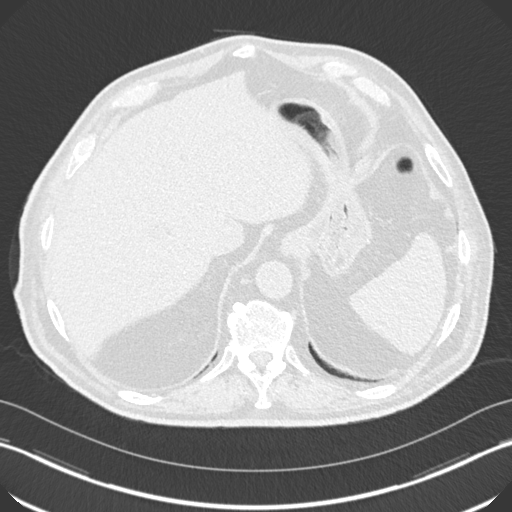
[im 44/149  lung]
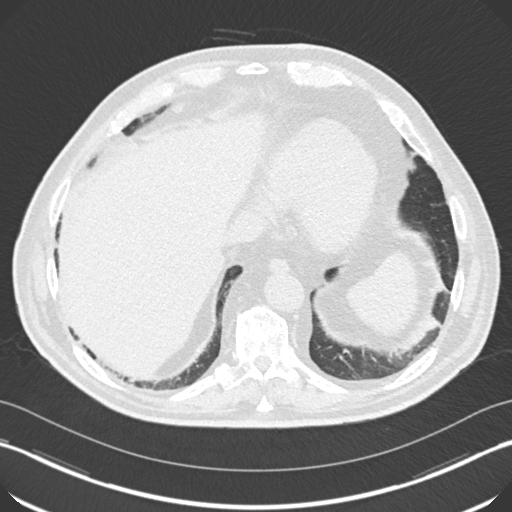
[im 55/149  mediastinal]
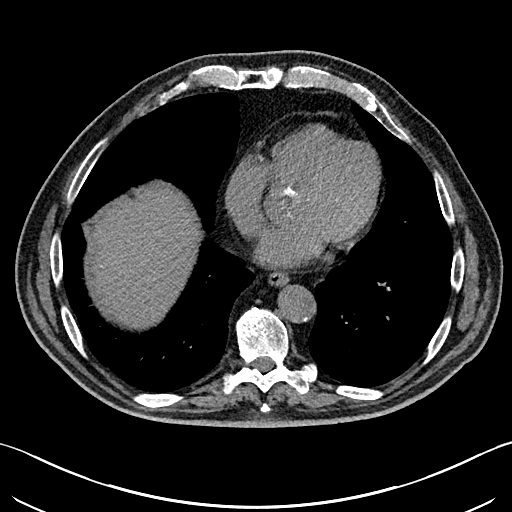
[im 55/149  lung]
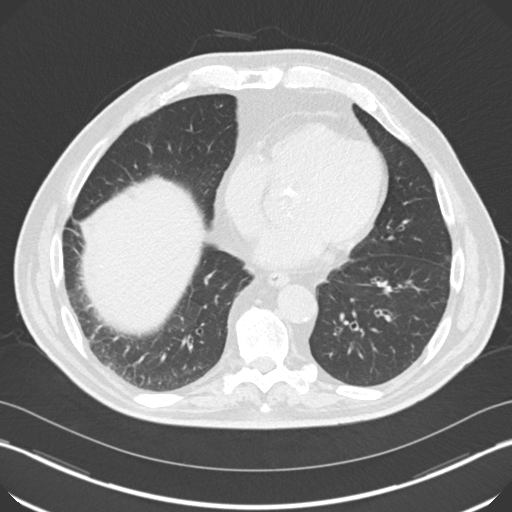
[im 66/149  lung]
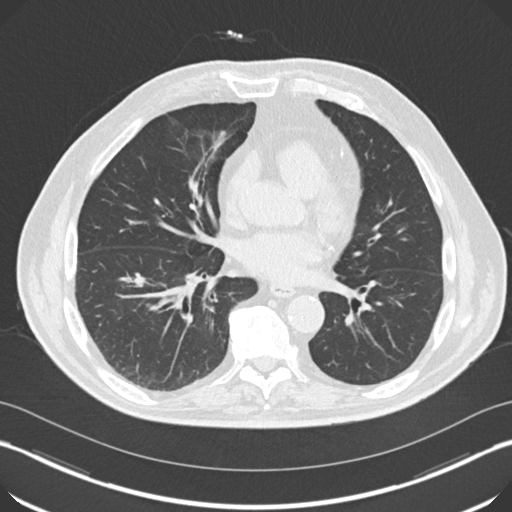
[im 83/149  lung]
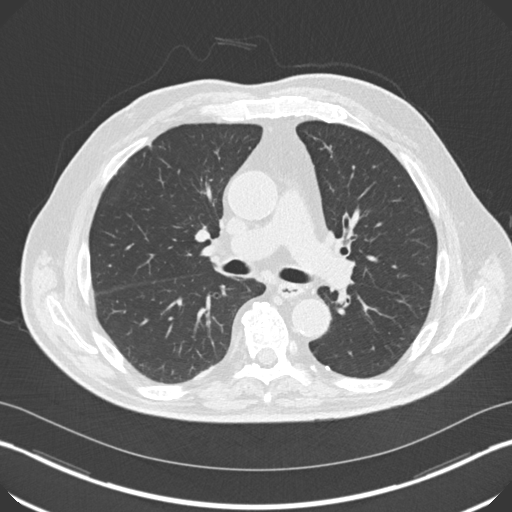
[im 94/149  lung]
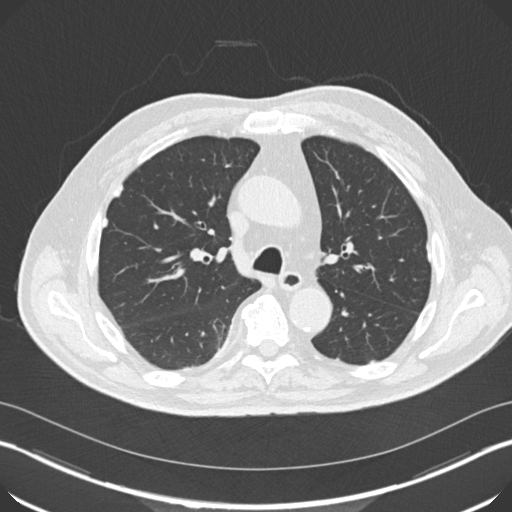
[im 105/149  mediastinal]
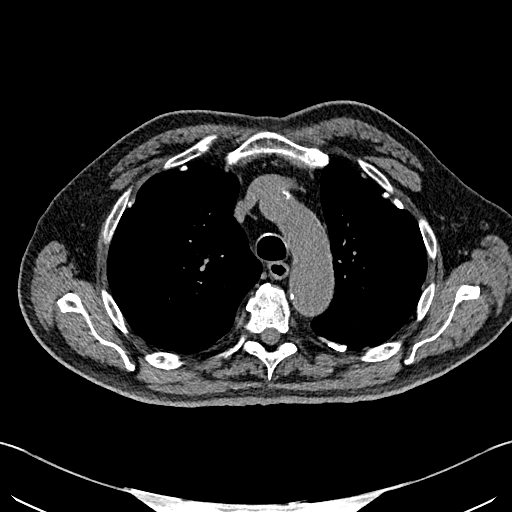
[im 105/149  lung]
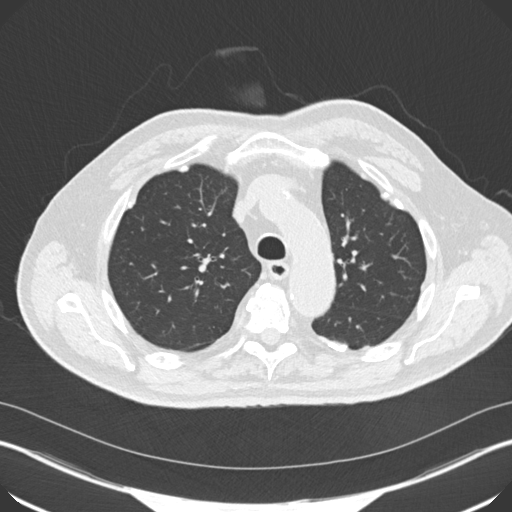
[im 116/149  lung]
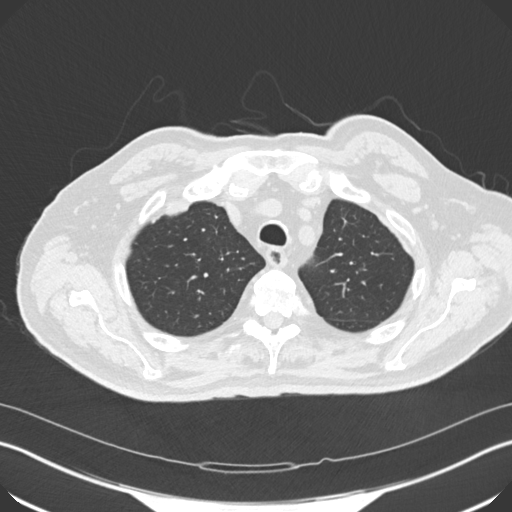
[im 127/149  lung]
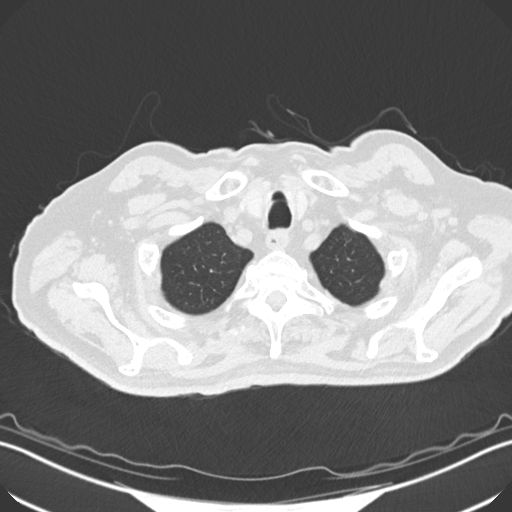
[im 138/149  lung]
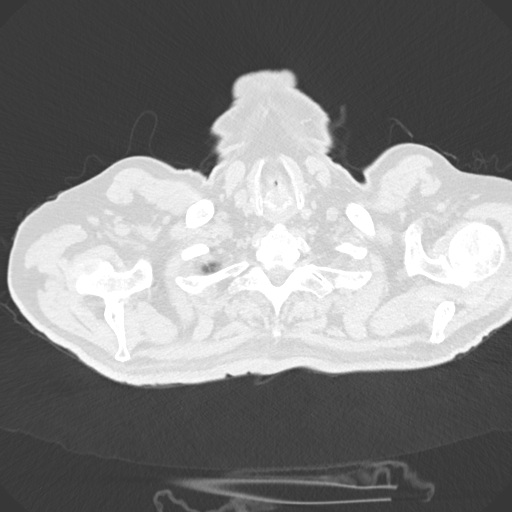

[Series 5: coronal · coronal · 0.59mm/px · 3 of 140 slices shown]
[im 28/140  lung]
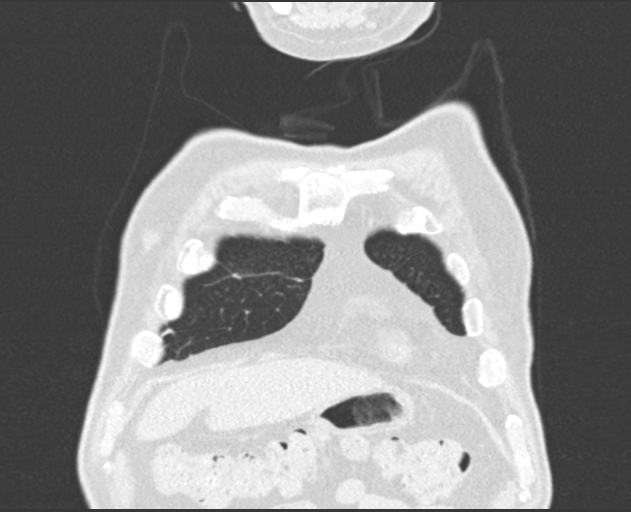
[im 56/140  lung]
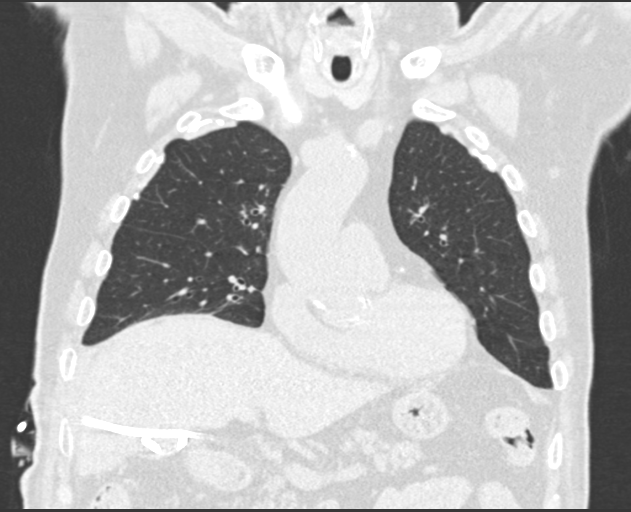
[im 84/140  lung]
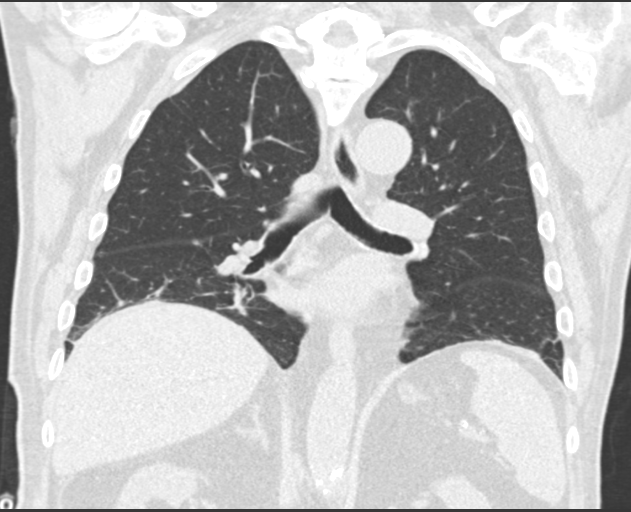

[15 of 36 positions shown; findings below may reference images not displayed]

FINDINGS: Cardiovascular: Normal heart size. Aortic atherosclerosis.
Calcification involving the RCA, LAD and left circumflex coronary
artery. No pericardial effusion.

Mediastinum/Nodes: The trachea appears patent and is midline. Normal
appearance of the esophagus. There is no enlarged mediastinal or
hilar adenopathy identified. No axillary or supraclavicular
adenopathy.

Lungs/Pleura: Bilateral calcified pleural plaques are identified
compatible with prior asbestos exposure. Right lower lobe pulmonary
nodule measures 5 mm, image 63 of series 3. 8 mm right lower lobe
pulmonary nodule is identified, image 73 of series 3. 5 mm right
lower lobe lung nodule is also identified, image 85 of series 3. 9
mm right lower lobe pulmonary nodule is identified, image 90 of
series 3. Within the left lower lobe there is a perifissural nodule
measuring 5 mm, image 93 of series 3. Subpleural nodule in the left
lower lobe measures 5 mm, image 103 of series 3.

Upper Abdomen: Right upper quadrant percutaneous drainage catheter
with pigtail in the gallbladder fossa is identified. There is been
resolution of the previous fluid collection. Stones identified
within the collapsed gallbladder.

Musculoskeletal: Degenerative disc disease identified within the
thoracic spine.
IMPRESSION: 1. Multiple pulmonary nodules are identified in both lungs measuring
up to 9 mm. Non-contrast chest CT at 3-6 months is recommended. If
the nodules are stable at time of repeat CT, then future CT at 18-24
months (from today's scan) is considered optional for low-risk
patients, but is recommended for high-risk patients. This
recommendation follows the consensus statement: Guidelines for
Management of Incidental Pulmonary Nodules Detected on CT Images:
2. Calcified pleural plaques compatible with prior asbestos
exposure.
3. Aortic atherosclerosis. Three vessel coronary artery
calcifications.
4. Interval resolution of right upper quadrant fluid collection
status post percutaneous drainage catheter placement.

## 2019-05-13 DIAGNOSIS — E538 Deficiency of other specified B group vitamins: Secondary | ICD-10-CM | POA: Diagnosis not present

## 2019-06-02 ENCOUNTER — Other Ambulatory Visit: Payer: Medicare HMO

## 2019-06-02 DIAGNOSIS — E782 Mixed hyperlipidemia: Secondary | ICD-10-CM | POA: Diagnosis not present

## 2019-06-02 DIAGNOSIS — D508 Other iron deficiency anemias: Secondary | ICD-10-CM | POA: Diagnosis not present

## 2019-06-02 DIAGNOSIS — E785 Hyperlipidemia, unspecified: Secondary | ICD-10-CM | POA: Diagnosis not present

## 2019-06-02 DIAGNOSIS — E1169 Type 2 diabetes mellitus with other specified complication: Secondary | ICD-10-CM | POA: Diagnosis not present

## 2019-06-02 DIAGNOSIS — N184 Chronic kidney disease, stage 4 (severe): Secondary | ICD-10-CM | POA: Diagnosis not present

## 2019-06-03 ENCOUNTER — Ambulatory Visit: Payer: Medicare HMO | Attending: Internal Medicine

## 2019-06-03 DIAGNOSIS — Z20828 Contact with and (suspected) exposure to other viral communicable diseases: Secondary | ICD-10-CM | POA: Diagnosis not present

## 2019-06-03 DIAGNOSIS — Z20822 Contact with and (suspected) exposure to covid-19: Secondary | ICD-10-CM

## 2019-06-05 LAB — NOVEL CORONAVIRUS, NAA: SARS-CoV-2, NAA: NOT DETECTED

## 2019-06-07 ENCOUNTER — Telehealth: Payer: Self-pay

## 2019-06-07 NOTE — Telephone Encounter (Signed)
Pt notified of negative COVID-19 results. Understanding verbalized.  Benjamin Austin   

## 2019-06-09 DIAGNOSIS — Z87891 Personal history of nicotine dependence: Secondary | ICD-10-CM | POA: Diagnosis not present

## 2019-06-09 DIAGNOSIS — E119 Type 2 diabetes mellitus without complications: Secondary | ICD-10-CM | POA: Diagnosis not present

## 2019-06-09 DIAGNOSIS — Z79899 Other long term (current) drug therapy: Secondary | ICD-10-CM | POA: Diagnosis not present

## 2019-06-09 DIAGNOSIS — N401 Enlarged prostate with lower urinary tract symptoms: Secondary | ICD-10-CM | POA: Diagnosis not present

## 2019-06-09 DIAGNOSIS — R1031 Right lower quadrant pain: Secondary | ICD-10-CM | POA: Diagnosis not present

## 2019-06-09 DIAGNOSIS — Z0000 Encounter for general adult medical examination without abnormal findings: Secondary | ICD-10-CM | POA: Diagnosis not present

## 2019-06-09 DIAGNOSIS — R1030 Lower abdominal pain, unspecified: Secondary | ICD-10-CM | POA: Diagnosis not present

## 2019-06-09 DIAGNOSIS — R1032 Left lower quadrant pain: Secondary | ICD-10-CM | POA: Diagnosis not present

## 2019-06-09 DIAGNOSIS — R3914 Feeling of incomplete bladder emptying: Secondary | ICD-10-CM | POA: Diagnosis not present

## 2019-06-14 DIAGNOSIS — E538 Deficiency of other specified B group vitamins: Secondary | ICD-10-CM | POA: Diagnosis not present

## 2019-06-28 IMAGING — DX DG ABDOMEN 1V
2 series · 2 of 2 positions shown · non-contrast
Comparison: None.

CLINICAL DATA: Pt in surgery and they wanted to check for foreign
body or any retained objects from surgery.

EXAM:
ABDOMEN - 1 VIEW

[abdomen kub (1 of 2)]
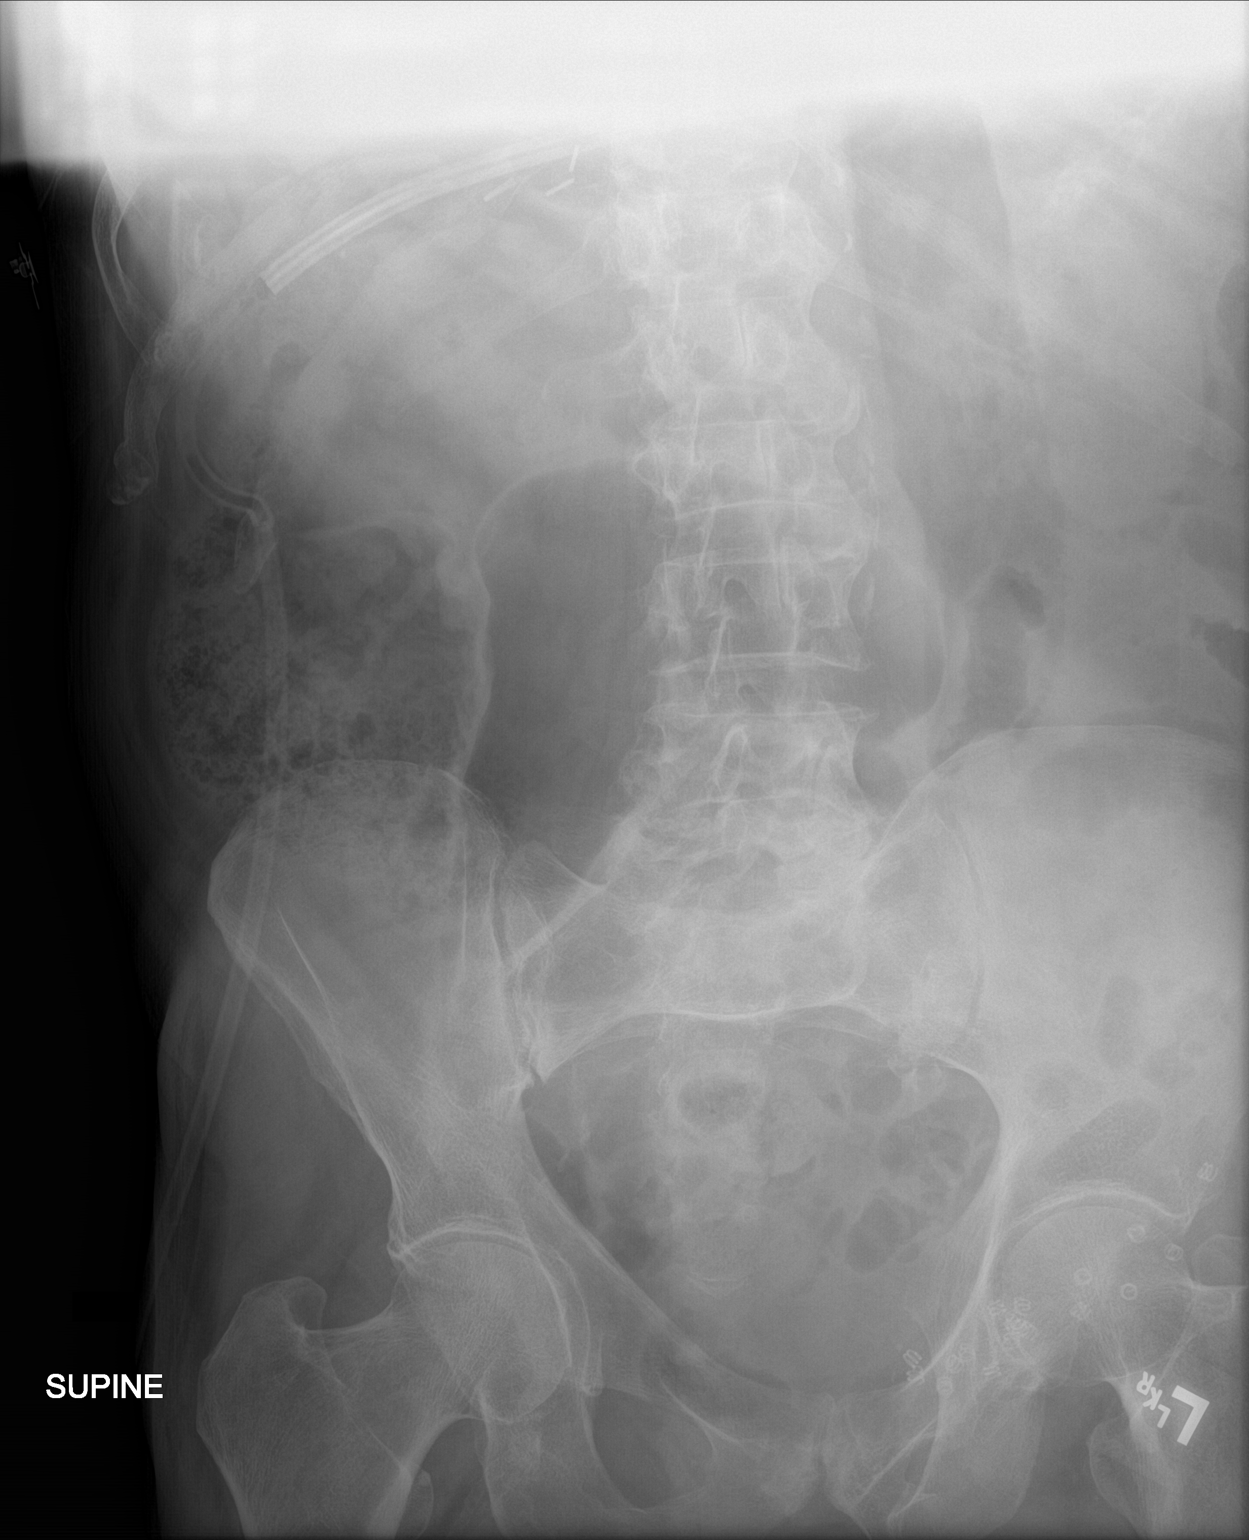

[abdomen kub (2 of 2)]
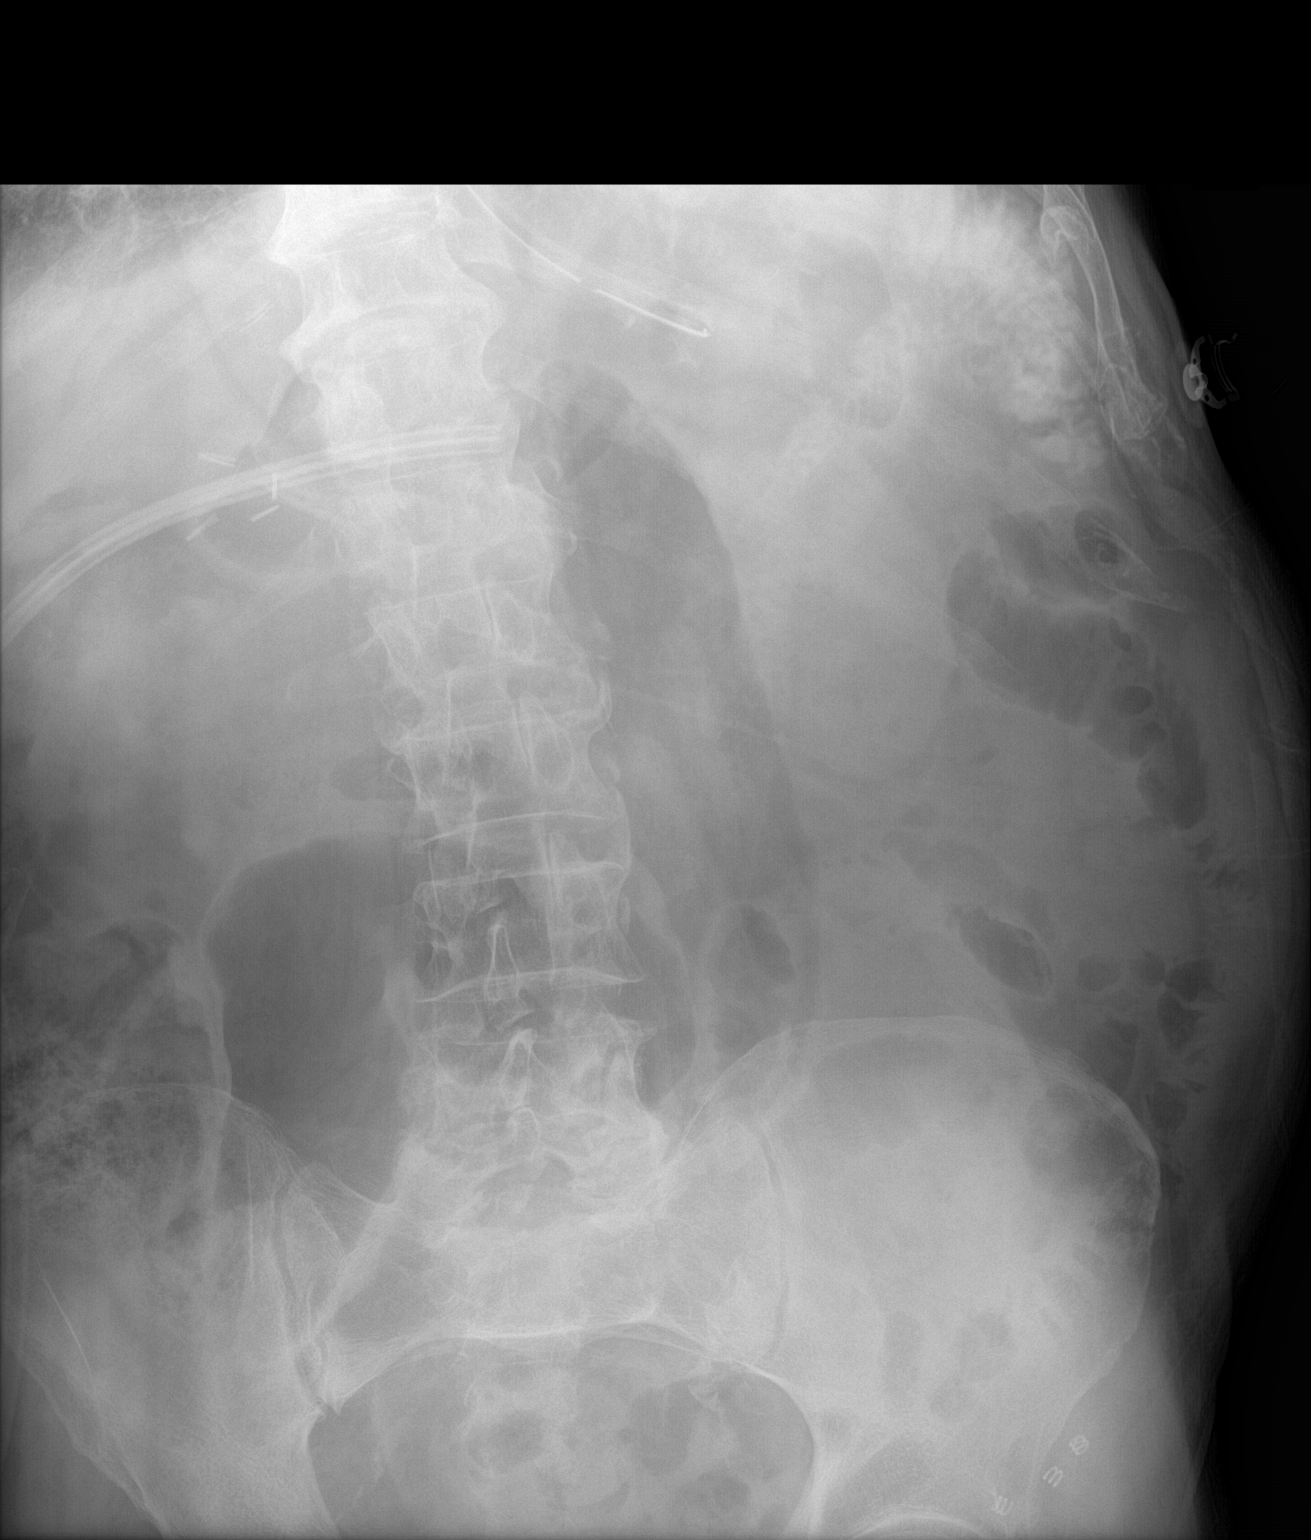

[2 of 2 positions shown; findings below may reference images not displayed]

FINDINGS: There is a nasogastric tube with the tip projecting over the
stomach. The bowel gas pattern is normal. There is a right upper
quadrant drain. There are surgical staples in the right upper
quadrant from cholecystectomy. There is a small amount of contrast
in the small bowel from intraoperative cholangiogram. No
radio-opaque calculi or other significant radiographic abnormality
are seen.

There is no radiopaque foreign body.
IMPRESSION: No radiopaque foreign body within the abdomen.

## 2019-07-15 DIAGNOSIS — S8252XA Displaced fracture of medial malleolus of left tibia, initial encounter for closed fracture: Secondary | ICD-10-CM | POA: Diagnosis not present

## 2019-07-15 DIAGNOSIS — S82832A Other fracture of upper and lower end of left fibula, initial encounter for closed fracture: Secondary | ICD-10-CM | POA: Diagnosis not present

## 2019-07-15 DIAGNOSIS — Z87891 Personal history of nicotine dependence: Secondary | ICD-10-CM | POA: Diagnosis not present

## 2019-07-15 DIAGNOSIS — E538 Deficiency of other specified B group vitamins: Secondary | ICD-10-CM | POA: Diagnosis not present

## 2019-07-15 DIAGNOSIS — S8262XA Displaced fracture of lateral malleolus of left fibula, initial encounter for closed fracture: Secondary | ICD-10-CM | POA: Diagnosis not present

## 2019-07-20 DIAGNOSIS — E785 Hyperlipidemia, unspecified: Secondary | ICD-10-CM | POA: Diagnosis not present

## 2019-07-20 DIAGNOSIS — E1169 Type 2 diabetes mellitus with other specified complication: Secondary | ICD-10-CM | POA: Diagnosis not present

## 2019-07-20 DIAGNOSIS — S82832A Other fracture of upper and lower end of left fibula, initial encounter for closed fracture: Secondary | ICD-10-CM | POA: Diagnosis not present

## 2019-08-16 DIAGNOSIS — E538 Deficiency of other specified B group vitamins: Secondary | ICD-10-CM | POA: Diagnosis not present

## 2019-08-24 DIAGNOSIS — S82832D Other fracture of upper and lower end of left fibula, subsequent encounter for closed fracture with routine healing: Secondary | ICD-10-CM | POA: Diagnosis not present

## 2019-09-01 DIAGNOSIS — I340 Nonrheumatic mitral (valve) insufficiency: Secondary | ICD-10-CM | POA: Diagnosis not present

## 2019-09-16 DIAGNOSIS — E538 Deficiency of other specified B group vitamins: Secondary | ICD-10-CM | POA: Diagnosis not present

## 2019-10-18 DIAGNOSIS — E538 Deficiency of other specified B group vitamins: Secondary | ICD-10-CM | POA: Diagnosis not present

## 2019-11-18 DIAGNOSIS — E538 Deficiency of other specified B group vitamins: Secondary | ICD-10-CM | POA: Diagnosis not present

## 2019-12-01 DIAGNOSIS — E785 Hyperlipidemia, unspecified: Secondary | ICD-10-CM | POA: Diagnosis not present

## 2019-12-01 DIAGNOSIS — E782 Mixed hyperlipidemia: Secondary | ICD-10-CM | POA: Diagnosis not present

## 2019-12-01 DIAGNOSIS — N1832 Chronic kidney disease, stage 3b: Secondary | ICD-10-CM | POA: Diagnosis not present

## 2019-12-01 DIAGNOSIS — E1169 Type 2 diabetes mellitus with other specified complication: Secondary | ICD-10-CM | POA: Diagnosis not present

## 2019-12-08 DIAGNOSIS — E1169 Type 2 diabetes mellitus with other specified complication: Secondary | ICD-10-CM | POA: Diagnosis not present

## 2019-12-08 DIAGNOSIS — E1122 Type 2 diabetes mellitus with diabetic chronic kidney disease: Secondary | ICD-10-CM | POA: Diagnosis not present

## 2019-12-08 DIAGNOSIS — Z23 Encounter for immunization: Secondary | ICD-10-CM | POA: Diagnosis not present

## 2019-12-08 DIAGNOSIS — N1832 Chronic kidney disease, stage 3b: Secondary | ICD-10-CM | POA: Diagnosis not present

## 2019-12-08 DIAGNOSIS — R69 Illness, unspecified: Secondary | ICD-10-CM | POA: Diagnosis not present

## 2019-12-08 DIAGNOSIS — E782 Mixed hyperlipidemia: Secondary | ICD-10-CM | POA: Diagnosis not present

## 2019-12-08 DIAGNOSIS — Z87891 Personal history of nicotine dependence: Secondary | ICD-10-CM | POA: Diagnosis not present

## 2019-12-23 DIAGNOSIS — E538 Deficiency of other specified B group vitamins: Secondary | ICD-10-CM | POA: Diagnosis not present

## 2020-01-17 DIAGNOSIS — E113393 Type 2 diabetes mellitus with moderate nonproliferative diabetic retinopathy without macular edema, bilateral: Secondary | ICD-10-CM | POA: Diagnosis not present

## 2020-01-24 DIAGNOSIS — E538 Deficiency of other specified B group vitamins: Secondary | ICD-10-CM | POA: Diagnosis not present

## 2020-02-10 DIAGNOSIS — I340 Nonrheumatic mitral (valve) insufficiency: Secondary | ICD-10-CM | POA: Diagnosis not present

## 2020-02-16 DIAGNOSIS — G250 Essential tremor: Secondary | ICD-10-CM | POA: Diagnosis not present

## 2020-02-16 DIAGNOSIS — I340 Nonrheumatic mitral (valve) insufficiency: Secondary | ICD-10-CM | POA: Diagnosis not present

## 2020-02-16 DIAGNOSIS — N1832 Chronic kidney disease, stage 3b: Secondary | ICD-10-CM | POA: Diagnosis not present

## 2020-02-16 DIAGNOSIS — I129 Hypertensive chronic kidney disease with stage 1 through stage 4 chronic kidney disease, or unspecified chronic kidney disease: Secondary | ICD-10-CM | POA: Diagnosis not present

## 2020-02-16 DIAGNOSIS — Z23 Encounter for immunization: Secondary | ICD-10-CM | POA: Diagnosis not present

## 2020-02-16 DIAGNOSIS — N184 Chronic kidney disease, stage 4 (severe): Secondary | ICD-10-CM | POA: Diagnosis not present

## 2020-02-24 DIAGNOSIS — E538 Deficiency of other specified B group vitamins: Secondary | ICD-10-CM | POA: Diagnosis not present

## 2020-03-27 DIAGNOSIS — E538 Deficiency of other specified B group vitamins: Secondary | ICD-10-CM | POA: Diagnosis not present

## 2020-04-27 ENCOUNTER — Emergency Department: Payer: Medicare HMO

## 2020-04-27 ENCOUNTER — Emergency Department
Admission: EM | Admit: 2020-04-27 | Discharge: 2020-04-27 | Disposition: A | Discharge status: Home / Self Care | Payer: Medicare HMO | Attending: Emergency Medicine | Admitting: Emergency Medicine

## 2020-04-27 ENCOUNTER — Encounter: Payer: Self-pay | Admitting: Emergency Medicine

## 2020-04-27 ENCOUNTER — Other Ambulatory Visit: Payer: Self-pay

## 2020-04-27 DIAGNOSIS — R1032 Left lower quadrant pain: Secondary | ICD-10-CM | POA: Diagnosis not present

## 2020-04-27 DIAGNOSIS — Z9089 Acquired absence of other organs: Secondary | ICD-10-CM | POA: Diagnosis not present

## 2020-04-27 DIAGNOSIS — Z79899 Other long term (current) drug therapy: Secondary | ICD-10-CM | POA: Insufficient documentation

## 2020-04-27 DIAGNOSIS — R109 Unspecified abdominal pain: Secondary | ICD-10-CM | POA: Diagnosis not present

## 2020-04-27 DIAGNOSIS — I10 Essential (primary) hypertension: Secondary | ICD-10-CM | POA: Insufficient documentation

## 2020-04-27 DIAGNOSIS — Z7984 Long term (current) use of oral hypoglycemic drugs: Secondary | ICD-10-CM | POA: Diagnosis not present

## 2020-04-27 DIAGNOSIS — E119 Type 2 diabetes mellitus without complications: Secondary | ICD-10-CM | POA: Insufficient documentation

## 2020-04-27 DIAGNOSIS — Z87891 Personal history of nicotine dependence: Secondary | ICD-10-CM | POA: Diagnosis not present

## 2020-04-27 DIAGNOSIS — E538 Deficiency of other specified B group vitamins: Secondary | ICD-10-CM | POA: Diagnosis not present

## 2020-04-27 LAB — COMPREHENSIVE METABOLIC PANEL
ALT: 30 U/L (ref 0–44)
AST: 28 U/L (ref 15–41)
Albumin: 3.6 g/dL (ref 3.5–5.0)
Alkaline Phosphatase: 86 U/L (ref 38–126)
Anion gap: 9 (ref 5–15)
BUN: 30 mg/dL — ABNORMAL HIGH (ref 8–23)
CO2: 26 mmol/L (ref 22–32)
Calcium: 9 mg/dL (ref 8.9–10.3)
Chloride: 107 mmol/L (ref 98–111)
Creatinine, Ser: 1.5 mg/dL — ABNORMAL HIGH (ref 0.61–1.24)
GFR, Estimated: 45 mL/min — ABNORMAL LOW (ref >60)
Glucose, Bld: 144 mg/dL — ABNORMAL HIGH (ref 70–99)
Potassium: 4.5 mmol/L (ref 3.5–5.1)
Sodium: 142 mmol/L (ref 135–145)
Total Bilirubin: 0.5 mg/dL (ref 0.3–1.2)
Total Protein: 7.1 g/dL (ref 6.5–8.1)

## 2020-04-27 LAB — CBC WITH DIFFERENTIAL/PLATELET
Abs Immature Granulocytes: 0.06 10*3/uL (ref 0.00–0.07)
Basophils Absolute: 0.1 10*3/uL (ref 0.0–0.1)
Basophils Relative: 1 %
Eosinophils Absolute: 0.3 10*3/uL (ref 0.0–0.5)
Eosinophils Relative: 4 %
HCT: 41.2 % (ref 39.0–52.0)
Hemoglobin: 13.6 g/dL (ref 13.0–17.0)
Immature Granulocytes: 1 %
Lymphocytes Relative: 23 %
Lymphs Abs: 1.8 10*3/uL (ref 0.7–4.0)
MCH: 32 pg (ref 26.0–34.0)
MCHC: 33 g/dL (ref 30.0–36.0)
MCV: 96.9 fL (ref 80.0–100.0)
Monocytes Absolute: 0.7 10*3/uL (ref 0.1–1.0)
Monocytes Relative: 8 %
Neutro Abs: 4.8 10*3/uL (ref 1.7–7.7)
Neutrophils Relative %: 63 %
Platelets: 200 10*3/uL (ref 150–400)
RBC: 4.25 MIL/uL (ref 4.22–5.81)
RDW: 16.7 % — ABNORMAL HIGH (ref 11.5–15.5)
WBC: 7.7 10*3/uL (ref 4.0–10.5)
nRBC: 0 % (ref 0.0–0.2)

## 2020-04-27 LAB — LIPASE, BLOOD: Lipase: 21 U/L (ref 11–51)

## 2020-04-27 LAB — URINALYSIS, COMPLETE (UACMP) WITH MICROSCOPIC
Bacteria, UA: NONE SEEN
Bilirubin Urine: NEGATIVE
Glucose, UA: NEGATIVE mg/dL
Hgb urine dipstick: NEGATIVE
Ketones, ur: NEGATIVE mg/dL
Leukocytes,Ua: NEGATIVE
Nitrite: NEGATIVE
Protein, ur: NEGATIVE mg/dL
Specific Gravity, Urine: 1.019 (ref 1.005–1.030)
pH: 5 (ref 5.0–8.0)

## 2020-04-27 LAB — LACTIC ACID, PLASMA: Lactic Acid, Venous: 1.3 mmol/L (ref 0.5–1.9)

## 2020-04-27 MED ORDER — IOHEXOL 300 MG/ML  SOLN
75.0000 mL | Freq: Once | INTRAMUSCULAR | Status: AC | PRN
  Administered 2020-04-27: 75 mL via INTRAVENOUS

## 2020-04-27 NOTE — ED Notes (Signed)
Patient transported to CT 

## 2020-04-27 NOTE — ED Triage Notes (Signed)
Pt arrived to ED with c/o LLQ pain starting over 1 week ago. Pt states pain is worse with movement. Pt states constipation as well.

## 2020-04-27 NOTE — ED Notes (Signed)
First RN note:  Pt brought over by [facility] clinic for LLQ pain.

## 2020-04-27 NOTE — ED Notes (Signed)
Discharge instructions reviewed with patient and spouse , Pt denied pain or sob .

## 2020-04-27 NOTE — ED Notes (Addendum)
RN Lenny Pastel informed at this time about pt roomed in ED09. Pt in gown and on monitor at this time.

## 2020-04-27 NOTE — ED Provider Notes (Signed)
[facility] Emergency Department Provider Note   ____________________________________________   First MD Initiated Contact with Patient 04/27/20 1119     (approximate)  I have reviewed the triage vital signs and the nursing notes.   HISTORY  Chief Complaint Abdominal Pain    HPI Benjamin Austin is a 86 y.o. male with a stated past medical history of left inguinal hernia repair, type 2 diabetes, and chronic essential tremor who presents for left lower quadrant abdominal pain.  Patient was seen in [facility] this morning and was sent to the emergency department to "have a CT done".  Patient states that 1 week prior to arrival he began having aching, sore, left lower quadrant abdominal pain that did not radiate and was approximately 4/10.  Patient states that any movement worsens this pain and denies any specific relieving factors.         Past Medical History:  Diagnosis Date  . Anginal pain ([place])   . Arthritis   . B12 deficiency   . Diabetes mellitus without complication ([place])   . Gall stones, common bile duct 01/18/2017   Gallbladder infection   . GERD without esophagitis   . Heart murmur   . HOH (hard of hearing)   . Hypercholesteremia   . Hypertension   . Normochromic anemia   . Pneumonia 1994  . Tremors of nervous system     Patient Active Problem List   Diagnosis Date Noted  . Normochromic normocytic anemia 12/01/2017  . Encounter for adjustment of gastrointestinal device   . Other specified diseases of biliary tract   . Bile duct leak   . S/P cholecystectomy 04/02/2017  . Cholecystitis, chronic   . Calculus of gallbladder without cholecystitis without obstruction   . Hepatic lesion   . B12 deficiency 01/28/2017  . GERD without esophagitis 01/28/2017  . Mixed dyslipidemia 01/28/2017  . Type 2 diabetes mellitus without complication, without long-term current use of insulin ([place]) 01/28/2017  . Acute cholecystitis   . RUQ pain  01/17/2017  . Benign essential tremor 03/07/2016  . History of normocytic normochromic anemia 01/02/2016  . Vaccine counseling 01/02/2016    Past Surgical History:  Procedure Laterality Date  . APPENDECTOMY    . CATARACT EXTRACTION W/PHACO Right 05/12/2018   Procedure: CATARACT EXTRACTION PHACO AND INTRAOCULAR LENS PLACEMENT ([place]);  Surgeon: Birder Robson, MD;  Location: [facility] ORS;  Service: Ophthalmology;  Laterality: Right;  [place]  00:43 CDE 4.74 Fluid pack lot # [id number]  . CATARACT EXTRACTION W/PHACO Left 06/16/2018   Procedure: CATARACT EXTRACTION PHACO AND INTRAOCULAR LENS PLACEMENT (IOC) LEFT DIABETIC;  Surgeon: Birder Robson, MD;  Location: [facility] ORS;  Service: Ophthalmology;  Laterality: Left;  [place] 00:46.5 CDE 7.10 Fluid Pack Lot # [id number]  . CHOLECYSTECTOMY N/A 04/02/2017   Procedure: LAPAROSCOPIC CHOLECYSTECTOMY WITH INTRAOPERATIVE CHOLANGIOGRAM;  Surgeon: Jules Husbands, MD;  Location: [facility] ORS;  Service: General;  Laterality: N/A;  . COLONOSCOPY  12/16/2008  . ERCP N/A 03/04/2017   Procedure: ENDOSCOPIC RETROGRADE CHOLANGIOPANCREATOGRAPHY (ERCP);  Surgeon: Lucilla Lame, MD;  Location: [facility] ENDOSCOPY;  Service: Endoscopy;  Laterality: N/A;  . ERCP N/A 04/07/2017   Procedure: ENDOSCOPIC RETROGRADE CHOLANGIOPANCREATOGRAPHY (ERCP);  Surgeon: Lucilla Lame, MD;  Location: [facility] ENDOSCOPY;  Service: Endoscopy;  Laterality: N/A;  . ERCP N/A 06/06/2017   Procedure: ENDOSCOPIC RETROGRADE CHOLANGIOPANCREATOGRAPHY (ERCP);  Surgeon: Lucilla Lame, MD;  Location: [facility] ENDOSCOPY;  Service: Endoscopy;  Laterality: N/A;  . HERNIA REPAIR    . [place] W  CATHETER PLACEMENT  01/18/2017  . PROSTATE BIOPSY     x 2; benign    Prior to Admission medications   Medication Sig Start Date End Date Taking? Authorizing Provider  Calcium Carbonate-Vitamin D 600-400 MG-UNIT tablet Take 1 tablet by mouth daily.    [provider]  cyanocobalamin (,VITAMIN B-12,) 1000 MCG/ML injection  Inject 1,000 mcg into the muscle every 30 (thirty) days.  05/18/14   [provider]  docusate sodium (COLACE) 100 MG capsule Take 100 mg by mouth daily as needed (constipation).    [provider]  ferrous sulfate 325 (65 FE) MG tablet Take 1 tablet (325 mg total) 2 (two) times daily with a meal by mouth. Patient taking differently: Take 325 mg by mouth daily with breakfast.  04/14/17   Pabon, Diego F, MD  lisinopril (PRINIVIL,ZESTRIL) 20 MG tablet Take 20 mg by mouth daily. 11/07/16   [provider]  loratadine (CLARITIN) 10 MG tablet Take 10 mg by mouth daily as needed for allergies.    [provider]  metFORMIN (GLUCOPHAGE) 1000 MG tablet Take 1,000 mg by mouth 2 (two) times daily. 11/07/16   [provider]  Omega-3 Fatty Acids (FISH OIL) 1200 MG CAPS Take 1,200 mg by mouth daily with breakfast.    [provider]  Polyethyl Glycol-Propyl Glycol (SYSTANE) 0.4-0.3 % GEL ophthalmic gel Place 1 application into both eyes daily as needed (dry eyes).     [provider]  pravastatin (PRAVACHOL) 20 MG tablet Take 10 mg by mouth daily with supper.  11/07/16   [provider]    Allergies Patient has no known allergies.  Family History  Problem Relation Age of Onset  . Heart disease Father     Social History Social History   Tobacco Use  . Smoking status: Former Smoker    Types: Cigars  . Smokeless tobacco: Never Used  Vaping Use  . Vaping Use: Never used  Substance Use Topics  . Alcohol use: No  . Drug use: No    Review of Systems Constitutional: No fever/chills Eyes: No visual changes. ENT: No sore throat. Cardiovascular: Denies chest pain. Respiratory: Denies shortness of breath. Gastrointestinal: Endorses abdominal pain.  No nausea, no vomiting.  No diarrhea. Genitourinary: Negative for dysuria. Musculoskeletal: Negative for acute arthralgias Skin: Negative for rash. Neurological: Negative for headaches,  weakness/numbness/paresthesias in any extremity Psychiatric: Negative for suicidal ideation/homicidal ideation   ____________________________________________   PHYSICAL EXAM:  VITAL SIGNS: ED Triage Vitals  Enc Vitals Group     BP 04/27/20 1200 (!) 150/80     Pulse Rate 04/27/20 1116 62     Resp 04/27/20 1116 16     Temp --      Temp src --      SpO2 04/27/20 1116 99 %     Weight 04/27/20 1117 161 lb (73 kg)     Height 04/27/20 1117 5\' 9"  (1.753 m)     Head Circumference --      Peak Flow --      Pain Score 04/27/20 1116 9     Pain Loc --      Pain Edu? --      Excl. in [place]? --    Constitutional: Alert and oriented. Well appearing and in no acute distress. Eyes: Conjunctivae are normal. PERRL. Head: Atraumatic. Nose: No congestion/rhinnorhea. Mouth/Throat: Mucous membranes are moist. Neck: No stridor Cardiovascular: Grossly normal heart sounds.  Good peripheral circulation. Respiratory: Normal respiratory effort.  No  retractions. Gastrointestinal: Soft.  Tenderness to palpation left lower quadrant over the inguinal region. No distention. Musculoskeletal: No obvious deformities Neurologic:  Normal speech and language. No gross focal neurologic deficits are appreciated. Skin:  Skin is warm and dry. No rash noted. Psychiatric: Mood and affect are normal. Speech and behavior are normal.  ____________________________________________   LABS (all labs ordered are listed, but only abnormal results are displayed)  Labs Reviewed  COMPREHENSIVE METABOLIC PANEL - Abnormal; Notable for the following components:      Result Value   Glucose, Bld 144 (*)    BUN 30 (*)    Creatinine, Ser 1.50 (*)    GFR, Estimated 45 (*)    All other components within normal limits  CBC WITH DIFFERENTIAL/PLATELET - Abnormal; Notable for the following components:   RDW 16.7 (*)    All other components within normal limits  URINALYSIS, COMPLETE (UACMP) WITH MICROSCOPIC - Abnormal; Notable for the  following components:   Color, Urine YELLOW (*)    APPearance CLEAR (*)    All other components within normal limits  LIPASE, BLOOD  LACTIC ACID, PLASMA  LACTIC ACID, PLASMA    RADIOLOGY  ED MD interpretation: CT of the abdomen and pelvis with IV contrast shows no evidence of acute abnormalities including no edema, free fluid, free air, or any other evidence of ruptured hollow viscus  Official radiology report(s): CT Abdomen Pelvis W Contrast  Result Date: 04/27/2020 CLINICAL DATA:  Left lower quadrant abdominal pain beginning 1 week ago. Pain worse with movement. Also reports constipation. EXAM: CT ABDOMEN AND PELVIS WITH CONTRAST TECHNIQUE: Multidetector CT imaging of the abdomen and pelvis was performed using the standard protocol following bolus administration of intravenous contrast. CONTRAST:  36mL OMNIPAQUE IOHEXOL 300 MG/ML  SOLN COMPARISON:  04/21/2017 FINDINGS: Lower chest: 8 mm pleural base nodule, right lower lobe, stable. Areas of lung base linear opacity consistent with atelectasis or scarring, also similar to the prior study. No acute findings. Hepatobiliary: Decreased size of the right liver lobe with a small right portal vein. No liver mass or focal lesion. Small amount of air in the common bile duct and common hepatic duct. Status post cholecystectomy. No bile duct dilation. Pancreas: Unremarkable. No pancreatic ductal dilatation or surrounding inflammatory changes. Spleen: Normal in size without focal abnormality. Adrenals/Urinary Tract: Normal adrenal glands. Kidneys normal in size, orientation and position. Bilateral renal cortical thinning. Symmetric enhancement and excretion. 7 mm low-density right kidney upper pole cortical mass consistent with a cyst, stable. No other masses, no stones and no hydronephrosis. Normal ureters. Normal bladder. Stomach/Bowel: Normal stomach. Small bowel and colon are normal in caliber. No wall thickening. No inflammation. Mild colonic stool  burden. There are scattered left colon diverticula. No diverticulitis. Vascular/Lymphatic: Aortic atherosclerosis. No aneurysm. No enlarged lymph nodes. Reproductive: Prostate enlarged, 4.8 x 4.1 cm transversely. Other: Previous left inguinal herniorrhaphy. No current hernias. No ascites. Musculoskeletal: No fracture or acute finding.  No bone lesion. IMPRESSION: 1. No acute findings within the abdomen or pelvis. No findings to account for left lower quadrant pain. There are scattered left colon diverticula, but no evidence of diverticulitis. 2. Aortic atherosclerosis. Electronically Signed   By: Lajean Manes M.D.   On: 04/27/2020 13:35    ____________________________________________   PROCEDURES  Procedure(s) performed (including Critical Care):  .1-3 Lead EKG Interpretation Performed by: Naaman Plummer, MD Authorized by: Naaman Plummer, MD     Interpretation: normal     ECG rate:  56  ECG rate assessment: normal     Rhythm: sinus rhythm     Ectopy: none     Conduction: normal       ____________________________________________   INITIAL IMPRESSION / ASSESSMENT AND PLAN / ED COURSE  As part of my medical decision making, I reviewed the following data within the [facility] notes reviewed and incorporated, Labs reviewed, Old chart reviewed, Radiograph reviewed and Notes from prior ED visits reviewed and incorporated        Patient presents for abdominal pain.  Differential diagnosis includes appendicitis, abdominal aortic aneurysm, surgical biliary disease, pancreatitis, SBO, mesenteric ischemia, serious intra-abdominal bacterial illness, genital torsion. Doubt atypical ACS. Based on history, physical exam, radiologic/laboratory evaluation, there is no red flag results or symptomatology requiring emergent intervention or need for admission at this time Pt tolerating PO. Disposition: Patient will be discharged with strict return precautions and follow  up with primary MD within 12-24 hours for further evaluation. Patient understands that this still may have an early presentation of an emergent medical condition such as appendicitis that will require a recheck.      ____________________________________________   FINAL CLINICAL IMPRESSION(S) / ED DIAGNOSES  Final diagnoses:  Left lower quadrant abdominal pain     ED Discharge Orders    None       Note:  This document was prepared using Dragon voice recognition software and may include unintentional dictation errors.   Naaman Plummer, MD 04/27/20 1357

## 2020-05-01 DIAGNOSIS — R1030 Lower abdominal pain, unspecified: Secondary | ICD-10-CM | POA: Diagnosis not present

## 2020-05-29 DIAGNOSIS — E538 Deficiency of other specified B group vitamins: Secondary | ICD-10-CM | POA: Diagnosis not present

## 2020-06-05 DIAGNOSIS — N1832 Chronic kidney disease, stage 3b: Secondary | ICD-10-CM | POA: Diagnosis not present

## 2020-06-05 DIAGNOSIS — E782 Mixed hyperlipidemia: Secondary | ICD-10-CM | POA: Diagnosis not present

## 2020-06-05 DIAGNOSIS — E785 Hyperlipidemia, unspecified: Secondary | ICD-10-CM | POA: Diagnosis not present

## 2020-06-05 DIAGNOSIS — E1169 Type 2 diabetes mellitus with other specified complication: Secondary | ICD-10-CM | POA: Diagnosis not present

## 2020-06-12 DIAGNOSIS — E785 Hyperlipidemia, unspecified: Secondary | ICD-10-CM | POA: Diagnosis not present

## 2020-06-12 DIAGNOSIS — E119 Type 2 diabetes mellitus without complications: Secondary | ICD-10-CM | POA: Diagnosis not present

## 2020-06-12 DIAGNOSIS — Z0000 Encounter for general adult medical examination without abnormal findings: Secondary | ICD-10-CM | POA: Diagnosis not present

## 2020-06-29 DIAGNOSIS — E538 Deficiency of other specified B group vitamins: Secondary | ICD-10-CM | POA: Diagnosis not present

## 2020-07-12 DIAGNOSIS — I952 Hypotension due to drugs: Secondary | ICD-10-CM | POA: Diagnosis not present

## 2020-07-12 DIAGNOSIS — R259 Unspecified abnormal involuntary movements: Secondary | ICD-10-CM | POA: Diagnosis not present

## 2020-07-31 DIAGNOSIS — E538 Deficiency of other specified B group vitamins: Secondary | ICD-10-CM | POA: Diagnosis not present

## 2020-08-22 DIAGNOSIS — E1169 Type 2 diabetes mellitus with other specified complication: Secondary | ICD-10-CM | POA: Diagnosis not present

## 2020-08-22 DIAGNOSIS — I340 Nonrheumatic mitral (valve) insufficiency: Secondary | ICD-10-CM | POA: Diagnosis not present

## 2020-08-22 DIAGNOSIS — E782 Mixed hyperlipidemia: Secondary | ICD-10-CM | POA: Diagnosis not present

## 2020-08-22 DIAGNOSIS — E785 Hyperlipidemia, unspecified: Secondary | ICD-10-CM | POA: Diagnosis not present

## 2020-08-22 DIAGNOSIS — E1122 Type 2 diabetes mellitus with diabetic chronic kidney disease: Secondary | ICD-10-CM | POA: Diagnosis not present

## 2020-08-22 DIAGNOSIS — N184 Chronic kidney disease, stage 4 (severe): Secondary | ICD-10-CM | POA: Diagnosis not present

## 2020-08-31 DIAGNOSIS — E538 Deficiency of other specified B group vitamins: Secondary | ICD-10-CM | POA: Diagnosis not present

## 2020-10-02 DIAGNOSIS — E538 Deficiency of other specified B group vitamins: Secondary | ICD-10-CM | POA: Diagnosis not present

## 2020-11-02 DIAGNOSIS — E538 Deficiency of other specified B group vitamins: Secondary | ICD-10-CM | POA: Diagnosis not present

## 2020-12-04 DIAGNOSIS — E538 Deficiency of other specified B group vitamins: Secondary | ICD-10-CM | POA: Diagnosis not present

## 2020-12-05 DIAGNOSIS — E785 Hyperlipidemia, unspecified: Secondary | ICD-10-CM | POA: Diagnosis not present

## 2020-12-05 DIAGNOSIS — N1832 Chronic kidney disease, stage 3b: Secondary | ICD-10-CM | POA: Diagnosis not present

## 2020-12-05 DIAGNOSIS — E782 Mixed hyperlipidemia: Secondary | ICD-10-CM | POA: Diagnosis not present

## 2020-12-05 DIAGNOSIS — E1169 Type 2 diabetes mellitus with other specified complication: Secondary | ICD-10-CM | POA: Diagnosis not present

## 2020-12-12 DIAGNOSIS — E782 Mixed hyperlipidemia: Secondary | ICD-10-CM | POA: Diagnosis not present

## 2020-12-12 DIAGNOSIS — N1832 Chronic kidney disease, stage 3b: Secondary | ICD-10-CM | POA: Diagnosis not present

## 2020-12-12 DIAGNOSIS — I340 Nonrheumatic mitral (valve) insufficiency: Secondary | ICD-10-CM | POA: Diagnosis not present

## 2020-12-12 DIAGNOSIS — E1169 Type 2 diabetes mellitus with other specified complication: Secondary | ICD-10-CM | POA: Diagnosis not present

## 2020-12-12 DIAGNOSIS — E1122 Type 2 diabetes mellitus with diabetic chronic kidney disease: Secondary | ICD-10-CM | POA: Diagnosis not present

## 2020-12-19 DIAGNOSIS — R0789 Other chest pain: Secondary | ICD-10-CM | POA: Diagnosis not present

## 2020-12-19 DIAGNOSIS — R079 Chest pain, unspecified: Secondary | ICD-10-CM | POA: Diagnosis not present

## 2020-12-19 DIAGNOSIS — R1032 Left lower quadrant pain: Secondary | ICD-10-CM | POA: Diagnosis not present

## 2020-12-19 DIAGNOSIS — R1012 Left upper quadrant pain: Secondary | ICD-10-CM | POA: Diagnosis not present

## 2020-12-28 DIAGNOSIS — E782 Mixed hyperlipidemia: Secondary | ICD-10-CM | POA: Diagnosis not present

## 2020-12-28 DIAGNOSIS — E785 Hyperlipidemia, unspecified: Secondary | ICD-10-CM | POA: Diagnosis not present

## 2020-12-28 DIAGNOSIS — R42 Dizziness and giddiness: Secondary | ICD-10-CM | POA: Diagnosis not present

## 2020-12-28 DIAGNOSIS — R5383 Other fatigue: Secondary | ICD-10-CM | POA: Diagnosis not present

## 2020-12-28 DIAGNOSIS — E1169 Type 2 diabetes mellitus with other specified complication: Secondary | ICD-10-CM | POA: Diagnosis not present

## 2020-12-28 DIAGNOSIS — I340 Nonrheumatic mitral (valve) insufficiency: Secondary | ICD-10-CM | POA: Diagnosis not present

## 2020-12-28 DIAGNOSIS — Z66 Do not resuscitate: Secondary | ICD-10-CM | POA: Diagnosis not present

## 2021-01-04 DIAGNOSIS — E538 Deficiency of other specified B group vitamins: Secondary | ICD-10-CM | POA: Diagnosis not present

## 2021-01-17 DIAGNOSIS — I340 Nonrheumatic mitral (valve) insufficiency: Secondary | ICD-10-CM | POA: Diagnosis not present

## 2021-02-05 DIAGNOSIS — E538 Deficiency of other specified B group vitamins: Secondary | ICD-10-CM | POA: Diagnosis not present

## 2021-02-08 DIAGNOSIS — I340 Nonrheumatic mitral (valve) insufficiency: Secondary | ICD-10-CM | POA: Diagnosis not present

## 2021-03-08 DIAGNOSIS — E538 Deficiency of other specified B group vitamins: Secondary | ICD-10-CM | POA: Diagnosis not present

## 2021-04-10 DIAGNOSIS — E538 Deficiency of other specified B group vitamins: Secondary | ICD-10-CM | POA: Diagnosis not present

## 2021-05-11 DIAGNOSIS — E538 Deficiency of other specified B group vitamins: Secondary | ICD-10-CM | POA: Diagnosis not present

## 2021-06-07 DIAGNOSIS — E782 Mixed hyperlipidemia: Secondary | ICD-10-CM | POA: Diagnosis not present

## 2021-06-07 DIAGNOSIS — N1832 Chronic kidney disease, stage 3b: Secondary | ICD-10-CM | POA: Diagnosis not present

## 2021-06-07 DIAGNOSIS — E1169 Type 2 diabetes mellitus with other specified complication: Secondary | ICD-10-CM | POA: Diagnosis not present

## 2021-06-07 DIAGNOSIS — E785 Hyperlipidemia, unspecified: Secondary | ICD-10-CM | POA: Diagnosis not present

## 2021-06-14 DIAGNOSIS — Z0000 Encounter for general adult medical examination without abnormal findings: Secondary | ICD-10-CM | POA: Diagnosis not present

## 2021-06-14 DIAGNOSIS — E785 Hyperlipidemia, unspecified: Secondary | ICD-10-CM | POA: Diagnosis not present

## 2021-06-14 DIAGNOSIS — Z1389 Encounter for screening for other disorder: Secondary | ICD-10-CM | POA: Diagnosis not present

## 2021-06-14 DIAGNOSIS — E119 Type 2 diabetes mellitus without complications: Secondary | ICD-10-CM | POA: Diagnosis not present

## 2021-06-14 DIAGNOSIS — E538 Deficiency of other specified B group vitamins: Secondary | ICD-10-CM | POA: Diagnosis not present

## 2021-06-27 DIAGNOSIS — E119 Type 2 diabetes mellitus without complications: Secondary | ICD-10-CM | POA: Diagnosis not present

## 2021-07-16 DIAGNOSIS — H6123 Impacted cerumen, bilateral: Secondary | ICD-10-CM | POA: Diagnosis not present

## 2021-07-16 DIAGNOSIS — E538 Deficiency of other specified B group vitamins: Secondary | ICD-10-CM | POA: Diagnosis not present

## 2021-08-16 ENCOUNTER — Other Ambulatory Visit
Admission: RE | Admit: 2021-08-16 | Discharge: 2021-08-16 | Disposition: A | Discharge status: Home / Self Care | Payer: Medicare HMO | Source: Ambulatory Visit | Attending: Cardiology | Admitting: Cardiology

## 2021-08-16 DIAGNOSIS — E538 Deficiency of other specified B group vitamins: Secondary | ICD-10-CM | POA: Diagnosis not present

## 2021-08-16 DIAGNOSIS — I340 Nonrheumatic mitral (valve) insufficiency: Secondary | ICD-10-CM | POA: Diagnosis not present

## 2021-08-16 DIAGNOSIS — R058 Other specified cough: Secondary | ICD-10-CM | POA: Insufficient documentation

## 2021-08-16 DIAGNOSIS — R0981 Nasal congestion: Secondary | ICD-10-CM | POA: Diagnosis not present

## 2021-08-16 DIAGNOSIS — R059 Cough, unspecified: Secondary | ICD-10-CM | POA: Diagnosis not present

## 2021-08-16 LAB — BRAIN NATRIURETIC PEPTIDE: B Natriuretic Peptide: 574.4 pg/mL — ABNORMAL HIGH (ref 0.0–100.0)

## 2021-08-17 DIAGNOSIS — N1832 Chronic kidney disease, stage 3b: Secondary | ICD-10-CM | POA: Diagnosis not present

## 2021-08-17 DIAGNOSIS — E1122 Type 2 diabetes mellitus with diabetic chronic kidney disease: Secondary | ICD-10-CM | POA: Diagnosis not present

## 2021-08-17 DIAGNOSIS — J40 Bronchitis, not specified as acute or chronic: Secondary | ICD-10-CM | POA: Diagnosis not present

## 2021-08-17 DIAGNOSIS — E1169 Type 2 diabetes mellitus with other specified complication: Secondary | ICD-10-CM | POA: Diagnosis not present

## 2021-08-17 DIAGNOSIS — E785 Hyperlipidemia, unspecified: Secondary | ICD-10-CM | POA: Diagnosis not present

## 2021-08-17 DIAGNOSIS — Z03818 Encounter for observation for suspected exposure to other biological agents ruled out: Secondary | ICD-10-CM | POA: Diagnosis not present

## 2021-08-21 ENCOUNTER — Encounter: Payer: Self-pay | Admitting: Emergency Medicine

## 2021-08-21 ENCOUNTER — Other Ambulatory Visit: Payer: Self-pay

## 2021-08-21 ENCOUNTER — Emergency Department
Admission: EM | Admit: 2021-08-21 | Discharge: 2021-09-08 | Disposition: E | Payer: Medicare HMO | Attending: Emergency Medicine | Admitting: Emergency Medicine

## 2021-08-21 DIAGNOSIS — R404 Transient alteration of awareness: Secondary | ICD-10-CM | POA: Diagnosis not present

## 2021-08-21 DIAGNOSIS — I469 Cardiac arrest, cause unspecified: Secondary | ICD-10-CM | POA: Diagnosis not present

## 2021-08-21 DIAGNOSIS — R4020 Unspecified coma: Secondary | ICD-10-CM | POA: Diagnosis not present

## 2021-08-21 DIAGNOSIS — R791 Abnormal coagulation profile: Secondary | ICD-10-CM | POA: Diagnosis not present

## 2021-08-21 DIAGNOSIS — R0602 Shortness of breath: Secondary | ICD-10-CM | POA: Diagnosis not present

## 2021-08-21 DIAGNOSIS — R0689 Other abnormalities of breathing: Secondary | ICD-10-CM | POA: Diagnosis not present

## 2021-08-21 DIAGNOSIS — R739 Hyperglycemia, unspecified: Secondary | ICD-10-CM | POA: Diagnosis not present

## 2021-08-21 DIAGNOSIS — R0902 Hypoxemia: Secondary | ICD-10-CM | POA: Diagnosis not present

## 2021-08-21 LAB — COMPREHENSIVE METABOLIC PANEL
ALT: 154 U/L — ABNORMAL HIGH (ref 0–44)
AST: 167 U/L — ABNORMAL HIGH (ref 15–41)
Albumin: 3 g/dL — ABNORMAL LOW (ref 3.5–5.0)
Alkaline Phosphatase: 111 U/L (ref 38–126)
Anion gap: 14 (ref 5–15)
BUN: 48 mg/dL — ABNORMAL HIGH (ref 8–23)
CO2: 17 mmol/L — ABNORMAL LOW (ref 22–32)
Calcium: 8.6 mg/dL — ABNORMAL LOW (ref 8.9–10.3)
Chloride: 104 mmol/L (ref 98–111)
Creatinine, Ser: 2.09 mg/dL — ABNORMAL HIGH (ref 0.61–1.24)
GFR, Estimated: 30 mL/min — ABNORMAL LOW (ref >60)
Glucose, Bld: 413 mg/dL — ABNORMAL HIGH (ref 70–99)
Potassium: 4.3 mmol/L (ref 3.5–5.1)
Sodium: 135 mmol/L (ref 135–145)
Total Bilirubin: 1.4 mg/dL — ABNORMAL HIGH (ref 0.3–1.2)
Total Protein: 7.2 g/dL (ref 6.5–8.1)

## 2021-08-21 LAB — CBC
HCT: 53.4 % — ABNORMAL HIGH (ref 39.0–52.0)
Hemoglobin: 16.2 g/dL (ref 13.0–17.0)
MCH: 30.4 pg (ref 26.0–34.0)
MCHC: 30.3 g/dL (ref 30.0–36.0)
MCV: 100.2 fL — ABNORMAL HIGH (ref 80.0–100.0)
Platelets: 69 10*3/uL — ABNORMAL LOW (ref 150–400)
RBC: 5.33 MIL/uL (ref 4.22–5.81)
RDW: 18.6 % — ABNORMAL HIGH (ref 11.5–15.5)
WBC: 24.4 10*3/uL — ABNORMAL HIGH (ref 4.0–10.5)
nRBC: 0.6 % — ABNORMAL HIGH (ref 0.0–0.2)

## 2021-08-21 LAB — APTT: aPTT: 66 seconds — ABNORMAL HIGH (ref 24–36)

## 2021-08-21 LAB — BRAIN NATRIURETIC PEPTIDE: B Natriuretic Peptide: 585 pg/mL — ABNORMAL HIGH (ref 0.0–100.0)

## 2021-08-21 LAB — PROTIME-INR
INR: 2.4 — ABNORMAL HIGH (ref 0.8–1.2)
Prothrombin Time: 26.3 seconds — ABNORMAL HIGH (ref 11.4–15.2)

## 2021-08-21 MED ORDER — CALCIUM CHLORIDE 10 % IV SOLN
INTRAVENOUS | Status: DC | PRN
  Administered 2021-08-21: 1 g via INTRAVENOUS

## 2021-08-21 MED ORDER — SODIUM BICARBONATE 8.4 % IV SOLN
INTRAVENOUS | Status: DC | PRN
  Administered 2021-08-21: 50 meq via INTRAVENOUS

## 2021-08-21 MED ORDER — EPINEPHRINE 1 MG/10ML IJ SOSY
PREFILLED_SYRINGE | INTRAMUSCULAR | Status: DC | PRN
  Administered 2021-08-21 (×4): 1 mg via INTRAVENOUS

## 2021-09-08 NOTE — Code Documentation (Signed)
Pt brady on monitor. No pulses palpable. CPR restarted.  ?

## 2021-09-08 NOTE — Code Documentation (Signed)
Patient time of death occurred at 15. ?

## 2021-09-08 NOTE — ED Triage Notes (Addendum)
Pt to ED via [facility] from home. Per EMS they were called out for difficulty breathing. Pts initial O2 sat 74% on room air. Pt was placed on non re-breather with sats improving.  ? ?EMS reports that approximately 3 minutes PTA pt lost pulses. EMS gave 1 mg of epi PTA. ? ?PT arrives with a KING airway in place, pt being assisted with ventilation by BVM. CPR in progress upon arrival.  ? ?Pt was moved from EMS stretcher to ED stretcher and LUCAS applied to assist with compression.  ? ?KING airway was removed and ET tube placed by EDP. ?

## 2021-09-08 NOTE — ED Provider Notes (Signed)
? ?[facility] ?Provider Note ? ? ? Event Date/Time  ? First MD Initiated Contact with Patient 09/15/21 1617   ?  (approximate) ? ?History  ? ?Chief Complaint: Cardiac Arrest ? ?HPI ? ?Benjamin Austin is a 88 y.o. male with no known past medical history at the time of evaluation presents to the emergency department as a cardiac arrest.  According to EMS they were called out to the patient's residence for difficulty breathing they found the patient to be minimally responsive diaphoretic and dyspneic.  EMS started CPR shortly afterwards.  Patient arrived to the emergency department with CPR in progress with a King airway in place and bagging ventilations.  Patient has a good pulse with compressions. ? ?Physical Exam  ? ?Most recent vital signs: ?There were no vitals filed for this visit. ? ?General: Unresponsive, King airway in place.  Compressions being performed ?CV:  Pale in appearance.  Good pulse with compressions. ?Resp:  King airway in place.  Equal breath sounds bilaterally blood in the [facility] airway. ?Abd:  No distention. ?Other:  Left tibial IO in place. ? ? ?ED Results / Procedures / Treatments  ? ?MEDICATIONS ORDERED IN ED: ?Medications  ?EPINEPHrine (ADRENALIN) 1 MG/10ML injection (1 mg Intravenous Given Sep 15, 2021 1606)  ?calcium chloride injection (1 g Intravenous Given 09/15/2021 1603)  ?sodium bicarbonate injection (50 mEq Intravenous Given 2021/09/15 1603)  ? ? ? ?IMPRESSION / MDM / ASSESSMENT AND PLAN / ED COURSE  ?I reviewed the triage vital signs and the nursing notes. ? ?Patient arrived to the emergency department CPR in progress.  Upon arrival her pads were placed on the patient he was transferred to our bed and placed on a Lucas compression device.  CPR continued, epinephrine given through his IO while upper access was obtained.  King airway was replaced with an ET tube by myself and respiratory therapy assistance.  Continued to bag with significant pulmonary hemorrhage during  bagging.  Linton Rump device was paused and the patient appeared to be in PEA arrest.  Additional medications were given CPR continued.  At 1 point pulse was obtained although very faint, could not read a blood pressure in within 1 to 2 minutes patient became asystolic once again and CPR was restarted.  Patient continued to be in asystole versus PEA at times.  Significant pulmonary hemorrhage.  Time of death was called at 4:08 PM.  At that time patient was asystolic on the monitor with no pulse palpated. ? ?INTUBATION ?Performed by: Harvest Dark ? ?Required items: required blood products, implants, devices, and special equipment available ?Patient identity confirmed: provided demographic data and hospital-assigned identification number ?Time out: Immediately prior to procedure a "time out" was called to verify the correct patient, procedure, equipment, support staff and site/side marked as required. ? ?Indications: Cardiac arrest ? ?Intubation method: S4 Glidescope Laryngoscopy  ? ?Preoxygenation: 100% BVM ? ?Sedatives: None ?Paralytic: None ? ?Tube Size: 8.0 cuffed ? ?Post-procedure assessment: chest rise and ETCO2 monitor ?Breath sounds: equal and absent over the epigastrium ?Tube secured with: ETT holder ? ?Cardiopulmonary Resuscitation (CPR) Procedure Note ?Directed/Performed by: Harvest Dark ?I personally directed ancillary staff and/or performed CPR in an effort to regain return of spontaneous circulation and to maintain cardiac, neuro and systemic perfusion.  ? ?CRITICAL CARE ?Performed by: Harvest Dark ? ? ?Total critical care time: 30 minutes ? ?Critical care time was exclusive of separately billable procedures and treating other patients. ? ?Critical care was necessary to treat or prevent imminent or  life-threatening deterioration. ? ?Critical care was time spent personally by me on the following activities: development of treatment plan with patient and/or surrogate as well as nursing,  discussions with consultants, evaluation of patient's response to treatment, examination of patient, obtaining history from patient or surrogate, ordering and performing treatments and interventions, ordering and review of laboratory studies, ordering and review of radiographic studies, pulse oximetry and re-evaluation of patient's condition. ? ? ? ?FINAL CLINICAL IMPRESSION(S) / ED DIAGNOSES  ? ?Cardiac arrest ? ? ?Note:  This document was prepared using Dragon voice recognition software and may include unintentional dictation errors. ?  ?Harvest Dark, MD ?25-Aug-2021 1634 ? ?

## 2021-09-08 NOTE — ED Notes (Signed)
RN notified [facility] as pt wife would like the pt to be taken there for his funeral. RN talked to the rep of the funeral home, Ms [facility]. ?

## 2021-09-08 NOTE — Code Documentation (Signed)
Pt intubated with 8.0 tube secured at 24 at the lip. Blood noted in ET tube ?

## 2021-09-08 NOTE — Code Documentation (Signed)
Pt asystole on monitor after defib. TOD called at 1608 ?

## 2021-09-08 NOTE — ED Notes (Addendum)
Per Sharyn Lull at Dr. Raylene Miyamoto office. Pt is a currently a pt of Dr. Netty Starring and he will sign death certificate.  ?

## 2021-09-08 NOTE — Code Documentation (Addendum)
CPR paused for pulse check. Bradycardic on the monitor. Faint pulse present. Pt bleeding from ET tube.  ?

## 2021-09-08 NOTE — Code Documentation (Signed)
CPR paused for pulse check. Pt tachy on monitor at a rate of 113. Palpable pulse present. ?

## 2021-09-08 DEATH — deceased
